# Patient Record
Sex: Female | Born: 1945 | Race: White | Hispanic: No | Marital: Married | Smoking: Never smoker
Health system: Southern US, Community
[De-identification: ages and names within clinical notes are randomized; demographics above are authoritative.]

## PROBLEM LIST (undated history)

## (undated) DIAGNOSIS — K635 Polyp of colon: Secondary | ICD-10-CM

## (undated) DIAGNOSIS — K5792 Diverticulitis of intestine, part unspecified, without perforation or abscess without bleeding: Secondary | ICD-10-CM

## (undated) DIAGNOSIS — J189 Pneumonia, unspecified organism: Secondary | ICD-10-CM

## (undated) DIAGNOSIS — E559 Vitamin D deficiency, unspecified: Secondary | ICD-10-CM

## (undated) DIAGNOSIS — F419 Anxiety disorder, unspecified: Secondary | ICD-10-CM

## (undated) DIAGNOSIS — I499 Cardiac arrhythmia, unspecified: Secondary | ICD-10-CM

## (undated) DIAGNOSIS — D649 Anemia, unspecified: Secondary | ICD-10-CM

## (undated) DIAGNOSIS — E781 Pure hyperglyceridemia: Secondary | ICD-10-CM

## (undated) DIAGNOSIS — K5790 Diverticulosis of intestine, part unspecified, without perforation or abscess without bleeding: Secondary | ICD-10-CM

## (undated) DIAGNOSIS — M1990 Unspecified osteoarthritis, unspecified site: Secondary | ICD-10-CM

## (undated) HISTORY — DX: Unspecified osteoarthritis, unspecified site: M19.90

## (undated) HISTORY — DX: Pneumonia, unspecified organism: J18.9

## (undated) HISTORY — DX: Vitamin D deficiency, unspecified: E55.9

## (undated) HISTORY — DX: Diverticulosis of intestine, part unspecified, without perforation or abscess without bleeding: K57.90

## (undated) HISTORY — DX: Cardiac arrhythmia, unspecified: I49.9

## (undated) HISTORY — PX: COLONOSCOPY: SHX174

## (undated) HISTORY — PX: TONSILLECTOMY AND ADENOIDECTOMY: SUR1326

## (undated) HISTORY — DX: Polyp of colon: K63.5

## (undated) HISTORY — DX: Anemia, unspecified: D64.9

## (undated) HISTORY — DX: Pure hyperglyceridemia: E78.1

## (undated) HISTORY — DX: Anxiety disorder, unspecified: F41.9

## (undated) HISTORY — PX: MENISCUS REPAIR: SHX5179

## (undated) HISTORY — DX: Diverticulitis of intestine, part unspecified, without perforation or abscess without bleeding: K57.92

---

## 1997-07-08 ENCOUNTER — Other Ambulatory Visit: Admission: RE | Admit: 1997-07-08 | Discharge: 1997-07-08 | Payer: Self-pay | Admitting: Obstetrics and Gynecology

## 1997-08-16 ENCOUNTER — Emergency Department (HOSPITAL_COMMUNITY): Admission: EM | Admit: 1997-08-16 | Discharge: 1997-08-17 | Payer: Self-pay | Admitting: Emergency Medicine

## 1998-09-22 ENCOUNTER — Other Ambulatory Visit: Admission: RE | Admit: 1998-09-22 | Discharge: 1998-09-22 | Payer: Self-pay | Admitting: Obstetrics and Gynecology

## 1998-09-23 ENCOUNTER — Encounter (INDEPENDENT_AMBULATORY_CARE_PROVIDER_SITE_OTHER): Payer: Self-pay | Admitting: Specialist

## 1998-09-23 ENCOUNTER — Other Ambulatory Visit: Admission: RE | Admit: 1998-09-23 | Discharge: 1998-09-23 | Payer: Self-pay | Admitting: Obstetrics and Gynecology

## 1999-12-04 ENCOUNTER — Encounter: Admission: RE | Admit: 1999-12-04 | Discharge: 1999-12-04 | Payer: Self-pay | Admitting: Obstetrics and Gynecology

## 1999-12-04 ENCOUNTER — Encounter: Payer: Self-pay | Admitting: Obstetrics and Gynecology

## 2000-08-14 ENCOUNTER — Ambulatory Visit (HOSPITAL_COMMUNITY): Admission: RE | Admit: 2000-08-14 | Discharge: 2000-08-14 | Payer: Self-pay | Admitting: Gastroenterology

## 2000-08-14 ENCOUNTER — Encounter: Payer: Self-pay | Admitting: Gastroenterology

## 2000-10-09 ENCOUNTER — Ambulatory Visit (HOSPITAL_COMMUNITY): Admission: RE | Admit: 2000-10-09 | Discharge: 2000-10-09 | Payer: Self-pay | Admitting: Gastroenterology

## 2000-10-09 ENCOUNTER — Emergency Department (HOSPITAL_COMMUNITY): Admission: EM | Admit: 2000-10-09 | Discharge: 2000-10-10 | Payer: Self-pay | Admitting: Emergency Medicine

## 2000-10-09 ENCOUNTER — Encounter: Payer: Self-pay | Admitting: *Deleted

## 2000-10-09 ENCOUNTER — Encounter (INDEPENDENT_AMBULATORY_CARE_PROVIDER_SITE_OTHER): Payer: Self-pay | Admitting: Specialist

## 2000-11-14 ENCOUNTER — Other Ambulatory Visit: Admission: RE | Admit: 2000-11-14 | Discharge: 2000-11-14 | Payer: Self-pay | Admitting: Obstetrics and Gynecology

## 2000-12-05 ENCOUNTER — Encounter: Admission: RE | Admit: 2000-12-05 | Discharge: 2000-12-05 | Payer: Self-pay | Admitting: Obstetrics and Gynecology

## 2000-12-05 ENCOUNTER — Encounter: Payer: Self-pay | Admitting: Obstetrics and Gynecology

## 2001-12-16 ENCOUNTER — Encounter: Payer: Self-pay | Admitting: Obstetrics and Gynecology

## 2001-12-16 ENCOUNTER — Encounter: Admission: RE | Admit: 2001-12-16 | Discharge: 2001-12-16 | Payer: Self-pay | Admitting: Obstetrics and Gynecology

## 2002-07-06 ENCOUNTER — Ambulatory Visit (HOSPITAL_COMMUNITY): Admission: RE | Admit: 2002-07-06 | Discharge: 2002-07-06 | Payer: Self-pay | Admitting: Emergency Medicine

## 2002-07-07 ENCOUNTER — Encounter: Payer: Self-pay | Admitting: Emergency Medicine

## 2002-07-07 ENCOUNTER — Ambulatory Visit (HOSPITAL_COMMUNITY): Admission: RE | Admit: 2002-07-07 | Discharge: 2002-07-07 | Payer: Self-pay | Admitting: Emergency Medicine

## 2003-01-14 ENCOUNTER — Ambulatory Visit (HOSPITAL_COMMUNITY): Admission: RE | Admit: 2003-01-14 | Discharge: 2003-01-14 | Payer: Self-pay | Admitting: Obstetrics and Gynecology

## 2003-03-01 ENCOUNTER — Other Ambulatory Visit: Admission: RE | Admit: 2003-03-01 | Discharge: 2003-03-01 | Payer: Self-pay | Admitting: Obstetrics and Gynecology

## 2003-12-17 ENCOUNTER — Ambulatory Visit: Payer: Self-pay | Admitting: Internal Medicine

## 2004-01-25 ENCOUNTER — Encounter: Admission: RE | Admit: 2004-01-25 | Discharge: 2004-01-25 | Payer: Self-pay | Admitting: Obstetrics and Gynecology

## 2004-03-27 ENCOUNTER — Ambulatory Visit (HOSPITAL_COMMUNITY): Admission: RE | Admit: 2004-03-27 | Discharge: 2004-03-27 | Payer: Self-pay | Admitting: Gastroenterology

## 2004-12-14 ENCOUNTER — Ambulatory Visit: Payer: Self-pay | Admitting: Internal Medicine

## 2005-02-12 ENCOUNTER — Encounter: Admission: RE | Admit: 2005-02-12 | Discharge: 2005-02-12 | Payer: Self-pay | Admitting: Obstetrics and Gynecology

## 2005-12-19 ENCOUNTER — Ambulatory Visit: Payer: Self-pay | Admitting: Internal Medicine

## 2006-02-05 HISTORY — PX: COLECTOMY: SHX59

## 2006-03-25 ENCOUNTER — Encounter: Admission: RE | Admit: 2006-03-25 | Discharge: 2006-03-25 | Payer: Self-pay | Admitting: Obstetrics and Gynecology

## 2006-05-14 ENCOUNTER — Encounter: Admission: RE | Admit: 2006-05-14 | Discharge: 2006-08-12 | Payer: Self-pay | Admitting: Family Medicine

## 2006-07-16 ENCOUNTER — Encounter: Payer: Self-pay | Admitting: Internal Medicine

## 2006-07-20 ENCOUNTER — Encounter: Admission: RE | Admit: 2006-07-20 | Discharge: 2006-07-20 | Payer: Self-pay | Admitting: Gastroenterology

## 2006-08-27 ENCOUNTER — Encounter: Payer: Self-pay | Admitting: Internal Medicine

## 2006-09-30 ENCOUNTER — Encounter: Payer: Self-pay | Admitting: Internal Medicine

## 2006-12-10 ENCOUNTER — Inpatient Hospital Stay (HOSPITAL_COMMUNITY): Admission: RE | Admit: 2006-12-10 | Discharge: 2006-12-14 | Payer: Self-pay | Admitting: General Surgery

## 2006-12-10 ENCOUNTER — Encounter (INDEPENDENT_AMBULATORY_CARE_PROVIDER_SITE_OTHER): Payer: Self-pay | Admitting: General Surgery

## 2007-01-07 ENCOUNTER — Encounter: Payer: Self-pay | Admitting: Internal Medicine

## 2007-01-22 ENCOUNTER — Encounter: Payer: Self-pay | Admitting: Internal Medicine

## 2007-08-21 ENCOUNTER — Encounter: Admission: RE | Admit: 2007-08-21 | Discharge: 2007-08-21 | Payer: Self-pay | Admitting: Obstetrics and Gynecology

## 2007-08-25 ENCOUNTER — Encounter: Admission: RE | Admit: 2007-08-25 | Discharge: 2007-08-25 | Payer: Self-pay | Admitting: General Surgery

## 2008-08-23 ENCOUNTER — Encounter: Admission: RE | Admit: 2008-08-23 | Discharge: 2008-08-23 | Payer: Self-pay | Admitting: Obstetrics and Gynecology

## 2008-11-01 ENCOUNTER — Encounter: Admission: RE | Admit: 2008-11-01 | Discharge: 2008-11-25 | Payer: Self-pay | Admitting: Family Medicine

## 2009-03-24 ENCOUNTER — Emergency Department (HOSPITAL_BASED_OUTPATIENT_CLINIC_OR_DEPARTMENT_OTHER): Admission: EM | Admit: 2009-03-24 | Discharge: 2009-03-24 | Payer: Self-pay | Admitting: Emergency Medicine

## 2009-08-24 ENCOUNTER — Encounter: Admission: RE | Admit: 2009-08-24 | Discharge: 2009-08-24 | Payer: Self-pay | Admitting: Obstetrics and Gynecology

## 2009-09-03 ENCOUNTER — Encounter: Admission: RE | Admit: 2009-09-03 | Discharge: 2009-09-03 | Payer: Self-pay | Admitting: Orthopaedic Surgery

## 2009-09-05 ENCOUNTER — Ambulatory Visit (HOSPITAL_BASED_OUTPATIENT_CLINIC_OR_DEPARTMENT_OTHER): Admission: RE | Admit: 2009-09-05 | Discharge: 2009-09-05 | Payer: Self-pay | Admitting: Orthopedic Surgery

## 2010-01-16 ENCOUNTER — Encounter: Payer: Self-pay | Admitting: Internal Medicine

## 2010-02-03 ENCOUNTER — Ambulatory Visit: Payer: Self-pay | Admitting: Internal Medicine

## 2010-02-03 ENCOUNTER — Encounter: Payer: Self-pay | Admitting: Internal Medicine

## 2010-02-03 DIAGNOSIS — R002 Palpitations: Secondary | ICD-10-CM | POA: Insufficient documentation

## 2010-02-09 ENCOUNTER — Ambulatory Visit: Admit: 2010-02-09 | Payer: Self-pay

## 2010-02-25 ENCOUNTER — Encounter: Payer: Self-pay | Admitting: General Surgery

## 2010-03-09 NOTE — Letter (Signed)
Summary: Dawn Payne - Office Visit  [facility] - Office Visit   Imported By: Dawn Payne 02/02/2010 15:12:50  _____________________________________________________________________  External Attachment:    Type:   Image     Comment:   External Document

## 2010-03-09 NOTE — Assessment & Plan Note (Addendum)
Summary: np6/ progressive palps. pt has medicare,bcbs.gd   Visit Type:  Initial Consult Primary [place]:  Ross,Allan  CC:  palpitations.  History of Present Illness: Patient is 65 year old who was referred for palpitations. ThThe patient says she has had spells over the past several years..  Episodes occur as short bursts.  Last a few seconds.  Go away on own.  Episodes not associated with SOB or dizziness.  No syncope the patient is very active  Dances competively  No change in her ability to do this.  Current Medications (verified): 1)  Advil 200 Mg Tabs (Ibuprofen) .Marland Kitchen.. 1 Tab Prn 2)  Ambien 10 Mg Tabs (Zolpidem Tartrate) .Marland Kitchen.. 1 Tab At Bedtime 2-3 Weekly Prn 3)  B Complex  Tabs (B Complex Vitamins) .... Take 1 Tablet By Mouth Once A Day 4)  Vitamin D 1000 Unit Tabs (Cholecalciferol) .... Take 1 Tablet By Mouth Once A Day 5)  Ester-C  Tabs (Bioflavonoid Products) .... Take 1 Tablet By Mouth Once A Day 6)  Calcium 600+d Plus Minerals 600-400 Mg-Unit Tabs (Calcium Carbonate-Vit D-Min) .... Take 1 Tablet By Mouth Two Times A Day  Allergies: 1)  ! Sulfa  Past History:  Past Medical History: history of diverticulitis of colon  September 2008 Chronic insomnia Plantar fascitis  Past Surgical History: Colon resection Thumb nail removed.  Family History:  Notable for congestive heart failure and renal failure   in mother and prostate cancer in family.  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 Marital Status- Married No children Drnks several cups of 1/2 caff coffee per day.  Review of Systems       All systems reviewed.  Neg to the above problem except as noted above.  Vital Signs:  Patient profile:   65 year old female Height:      63 inches Weight:      140 pounds BMI:      24.89 Pulse rate:   65 / minute Pulse rhythm:   regular Resp:     18 per minute BP sitting:   140 / 70  (left arm) Cuff size:   large  Vitals Entered By: Vikki Ports (February 03, 2010 12:02 PM)  Physical Exam  Additional Exam:  Patient is in NAD HEENT:  Normocephalic, atraumatic. EOMI, PERRLA.  Neck: JVP is normal. No thyromegaly. No bruits.  Lungs: clear to auscultation. No rales no wheezes.  Heart: Regular rate and rhythm. Normal S1, S2. No S3.   No significant murmurs. PMI not displaced.  Abdomen:  Supple, nontender. Normal bowel sounds. No masses. No hepatomegaly.  Extremities:   Good distal pulses throughout. No lower extremity edema.  Musculoskeletal :moving all extremities.  Neuro:   alert and oriented x3.    EKG  Procedure date:  02/03/2010  Findings:      NSR.  65 bpm  Impression & Recommendations:  Problem # 1:  PALPITATIONS (ICD-785.1) Patients spells are short lived.  Do not sound hemodyn significant.    Otherwise is very active. I would recomm an echo. She needs to cut out caffeine.    If they persist despite cutting caffeine out could consider an event monitor.  Would not sched now. F/U as needed.  Patient will call with response  Other Orders: Echocardiogram (Echo)  Patient Instructions: 1)  Your physician recommends that you schedule a follow-up appointment in: we will call you 2)  Your physician has requested that you have an echocardiogram.  Echocardiography is a painless test that uses sound waves to create images of your heart. It provides your doctor with information about the size and shape of your heart and how well your heart's chambers and valves are working.  This procedure takes approximately one hour. There are no restrictions for this procedure.  Appended Document: np6/ progressive palps. pt has medicare,bcbs.gd Patient cancelled echo that was scheduled for 02/09/2010.

## 2010-04-21 LAB — ANAEROBIC CULTURE: Gram Stain: NONE SEEN

## 2010-04-21 LAB — POCT HEMOGLOBIN-HEMACUE: Hemoglobin: 14 g/dL (ref 12.0–15.0)

## 2010-04-21 LAB — WOUND CULTURE: Gram Stain: NONE SEEN

## 2010-04-27 LAB — BASIC METABOLIC PANEL
BUN: 10 mg/dL (ref 6–23)
CO2: 32 mEq/L (ref 19–32)
Calcium: 8 mg/dL — ABNORMAL LOW (ref 8.4–10.5)
Chloride: 107 mEq/L (ref 96–112)
Creatinine, Ser: 0.8 mg/dL (ref 0.4–1.2)
GFR calc Af Amer: >60 mL/min (ref >60)
GFR calc non Af Amer: >60 mL/min (ref >60)
Glucose, Bld: 95 mg/dL (ref 70–99)
Potassium: 3.7 mEq/L (ref 3.5–5.1)
Sodium: 144 mEq/L (ref 135–145)

## 2010-05-03 ENCOUNTER — Other Ambulatory Visit: Payer: Self-pay | Admitting: Family Medicine

## 2010-05-03 DIAGNOSIS — M5481 Occipital neuralgia: Secondary | ICD-10-CM

## 2010-05-07 ENCOUNTER — Ambulatory Visit
Admission: RE | Admit: 2010-05-07 | Discharge: 2010-05-07 | Disposition: A | Discharge status: Home / Self Care | Payer: Medicare Other | Source: Ambulatory Visit | Attending: Family Medicine | Admitting: Family Medicine

## 2010-05-07 ENCOUNTER — Other Ambulatory Visit: Payer: Self-pay

## 2010-05-07 DIAGNOSIS — M5481 Occipital neuralgia: Secondary | ICD-10-CM

## 2010-05-15 ENCOUNTER — Other Ambulatory Visit: Payer: Self-pay | Admitting: Family Medicine

## 2010-05-15 DIAGNOSIS — M5020 Other cervical disc displacement, unspecified cervical region: Secondary | ICD-10-CM

## 2010-05-15 DIAGNOSIS — R51 Headache: Secondary | ICD-10-CM

## 2010-05-16 ENCOUNTER — Ambulatory Visit
Admission: RE | Admit: 2010-05-16 | Discharge: 2010-05-16 | Disposition: A | Discharge status: Home / Self Care | Payer: Medicare Other | Source: Ambulatory Visit | Attending: Family Medicine | Admitting: Family Medicine

## 2010-05-16 DIAGNOSIS — M5020 Other cervical disc displacement, unspecified cervical region: Secondary | ICD-10-CM

## 2010-06-20 NOTE — Op Note (Signed)
NAME:  COREE, RIESTER                 ACCOUNT NO.:  [id number]   MEDICAL RECORD NO.:  [id number]          PATIENT TYPE:  INP   LOCATION:  NA                           FACILITY:  [facility]   PHYSICIAN:  Adolph Pollack, M.D.DATE OF BIRTH:  1945/05/27   DATE OF PROCEDURE:  12/10/2006  DATE OF DISCHARGE:                               OPERATIVE REPORT   PREOPERATIVE DIAGNOSIS:  Recurrent sigmoid diverticulitis.   POSTOPERATIVE DIAGNOSIS:  Recurrent sigmoid diverticulitis.   PROCEDURE:  Laparoscopic-assisted sigmoid colectomy with mobilization of  the splenic flexure.   SURGEON:  Adolph Pollack, M.D.   ASSISTANT:  Ollen Gross. Vernell Morgans, M.D.   ANESTHESIA:  General.   INDICATIONS:  This is a 65 year old female with recurrent sigmoid  diverticulitis.  She has been debating an operation for a number of  years now, and now presents for the procedure.   TECHNIQUE:  She is brought to the operating room and placed supine on  the operating room table.  General anesthetic was administered.  She was  placed in the lithotomy position.  Foley catheter was inserted.  The  abdomen and perineum were sterilely prepped and draped.  A  supraumbilical small incision was made through the skin, subcutaneous  tissue, fascia and peritoneum.  A pursestring suture of 0 Vicryl was  placed around the fascial edges.  A Hassan trocar was introduced into  the peritoneal cavity and pneumoperitoneum was created by insufflation  of CO2 gas.   Following this, the angled laparoscope was introduced.  A 5 mm trocar  was placed in the right lower quadrant, the suprapubic area, and the  left lower quadrant.  I examined the area and noted some sigmoid colon  that appeared to be chronically inflamed and adherent to the bilateral  pelvic sidewall.  I mobilized this using sharp dissection.  I divided  the lateral attachments from the sigmoid and descending colon up to the  splenic flexure.  I then performed the Omega maneuver  and mobilized the  splenic flexure using electrocautery and sharp and blunt dissection,  allowing the splenic flexure to drop distally.   Following this, I further medialized the descending sigmoid colon and  identified the left ureter; I kept my plane of dissection anterior and  medial to this.  I then mobilized the rectosigmoid junction by dividing  the lateral attachments, and also incising the lateral peritoneum off  the mid descending colon.  This allowed for good mobilization of the  rectosigmoid junction, as well as being in the mid descending colon down  to the pelvic region.   Of note, after this I went ahead and removed the 5 mm suprapubic trocar.  I made an extraction site incision by making a limited lower midline  incision in the vertical direction through skin, subcutaneous tissue,  fascia and peritoneum.  On dividing some of the peritoneum, there was a  small superficial  burn to the area of the small bowel; which I  imbricated this area with 3-0 silk Lembert type sutures.   I then used a wound protector and brought up  the mid descending to  rectosigmoid colon up into the wound.  I picked a point at the rectum  just distal to the rectosigmoid junction.  I made an incision in the  mesentery close to the colon.  I then used a LigaSure, staying close to  colon to divide the mesentery up to the point of the mid descending  colon; where I saw the last diverticulum.  Following this, I was able to  place the descending colon and the rectum side-to-side.  I performed a  side-to-side stapled anastomosis with the Endo-GIA stapler and  reinforced the crotch with a 3-0 silk suture.  I then completed the  anastomosis with a linear non-cutting stapler by closing the common  defect; and thus completed the resection as well.  I then handed the  specimen off the field.   The anastomosis was patent; it was viable and was under no tension.  There was some bleeding from the common defect  closure, which I  controlled with 3-0 Vicryl and 3-0 silk sutures.   At this point, gloves were changed.  The anastomosis was reexamined and  was intact with good lumen.  No bleeding was noted.  Irrigation was  performed of the abdominal cavity with saline solution.  No bleeding was  noted.  A small area in the small bowel area that was reinforced after  the superficial thermal burn looked good and viable.   I then requested a sponge and instrument count; it was reported to be  correct.  I evacuated the irrigation fluid.  The fascia of the lower  midline extraction site was closed with a running #1 PDS suture.  The  subcutaneous tissue was irrigated.  I reinsufflated the abdomen and  inserted the laparoscope.  The fascial closure was solid.  No bleeding  was noted.  I then released the pneumoperitoneum and removed the  trocars.   All skin incisions were closed with staples followed by sterile  dressings.   She tolerated the procedure without apparent complications, and was  taken to recovery room in satisfactory condition.      Adolph Pollack, M.D.  Electronically Signed     TJR/MEDQ  D:  12/10/2006  T:  12/10/2006  Job:  [id number]   cc:   Fayrene Fearing L. Malon Kindle., M.D.  Fax: [phone]   Titus Dubin. Alwyn Ren, MD,FACP,FCCP  [phone] [place]  [place] [address]

## 2010-06-20 NOTE — H&P (Signed)
NAME:  Dawn Payne, Dawn Payne                 ACCOUNT NO.:  [id number]   MEDICAL RECORD NO.:  [id number]          PATIENT TYPE:  INP   LOCATION:  NA                           FACILITY:  [facility]   PHYSICIAN:  Adolph Pollack, M.D.DATE OF BIRTH:  1945-08-10   DATE OF ADMISSION:  12/10/2006  DATE OF DISCHARGE:                              HISTORY & PHYSICAL   REASON FOR ADMISSION:  Elective sigmoid colectomy.   HISTORY OF PRESENT ILLNESS:  Dawn Payne is a 65 year old female who has  had recurrent bouts of sigmoid diverticulitis.  I had seen her  approximately 3 years ago, and we discussed the surgery, but she had did  not want that at this time.  However, she continued to have problems and  now is admitted for the partial colectomy.   PAST MEDICAL HISTORY:  1. Recurrent sigmoid diverticulitis.  2. Urinary tract infection.  3. Lumbar spine disease.   PREVIOUS OPERATIONS:  D&C.   ALLERGIES:  SULFA.   MEDICATION:  She takes multiple herbals and vitamins.   SOCIAL HISTORY:  No tobacco use, occasional alcohol use.   FAMILY HISTORY:  Notable for congestive heart failure and renal failure  in mother and prostate cancer in family.   REVIEW OF SYSTEMS:  She had difficulty with the bowel prep last night  and ended up having some nausea and vomiting specifically after the  antibiotics.  For that reason, she did not get her Entereg.   PHYSICAL EXAMINATION:  GENERAL:  A well-developed, well-nourished female  who appears slightly dehydrated.  She is pleasant and cooperative.  HEENT:  EYES:  Extraocular motions intact.  No icterus.  NECK:  Supple without any masses.  VITAL SIGNS:  Temperature is 97 degrees, pulse 60, blood pressure  142/72.  RESPIRATORY:  Breath sounds equal and clear. Respirations not  labored.  CARDIOVASCULAR:  Regular rate, regular rhythm.  No murmur.  ABDOMEN:  Soft, nontender, nondistended.  No masses.  MUSCULOSKELETAL:  SCDs on.   IMPRESSION:  Recurrent sigmoid  diverticulitis.   PLAN:  Laparoscopic-assisted, possible open sigmoid colectomy.  We  discussed the procedure and risks at length preoperatively.      Adolph Pollack, M.D.  Electronically Signed     TJR/MEDQ  D:  12/10/2006  T:  12/10/2006  Job:  [id number]

## 2010-06-23 NOTE — Procedures (Signed)
[place]  Patient:    Dawn Payne, Dawn Payne Visit Number: [id number] MRN: [id number]          Service Type: END Location: ENDO Attending Physician:  Charna Elizabeth Dictated by:   Anselmo Rod, M.D. Proc. Date: 10/09/00 Admit Date:  10/09/2000 Discharge Date: 10/09/2000   CC:         Titus Dubin. Alwyn Ren, M.D. [facility]   Procedure Report  DATE OF BIRTH:  03/14/1945  REFERRING PHYSICIAN:  Titus Dubin. Alwyn Ren, M.D. [facility]  PROCEDURE PERFORMED:  Colonoscopy with snare polypectomy x 1.  ENDOSCOPIST:  Anselmo Rod, M.D.  INSTRUMENT USED:  Olympus video pediatric colonoscope.  INDICATIONS FOR PROCEDURE:  Blood in stool and history of diverticulitis in a 65 year old white female rule out colonic polyps, masses, hemorrhoids, etc.  PREPROCEDURE PREPARATION:  Informed consent was procured from the patient. The patient was fasted for eight hours prior to the procedure and prepped with a bottle of magnesium citrate and a gallon of NuLytely the night prior to the procedure.  PREPROCEDURE PHYSICAL:  The patient had stable vital signs.  Neck supple. Chest clear to auscultation.  S1, S2 regular.  Abdomen soft with normal abdominal bowel sounds.  DESCRIPTION OF PROCEDURE:  The patient was placed in the left lateral decubitus position and sedated with 100 mg of Demerol and 10 mg of Versed intravenously.  Once the patient was adequately sedated and maintained on low-flow oxygen and continuous cardiac monitoring, the Olympus video colonoscope was advanced from the rectum to the cecum with slight difficulty secondary to some residual stool in the colon.  There was extensive left-sided diverticulosis, no masses were seen.  There was a small polyp seen at 50 cm which was sessile and nature.  It was snared by snare polypectomy forceps. Small internal hemorrhoids were appreciated on retroflexion in the rectum. The patient tolerated the procedure well without  complications.  The appendiceal orifice and ileocecal valve were clearly visualized and photographed.  IMPRESSION: 1. Left-sided diverticulosis. 2. Small sessile polyp snared at 15 cm. 3. Small internal hemorrhoids.  RECOMMENDATIONS: 1. Await pathology results. 2. Avoid nuts seeds and popcorn. 3. Outpatient follow-up in the next two weeks for further recommendations.Dictated by:   Anselmo Rod, M.D. Attending Physician:  Charna Elizabeth DD:  10/09/00 TD:  10/10/00 Job: [id number]

## 2010-06-23 NOTE — Op Note (Signed)
NAME:  Dawn Payne, Dawn Payne                 ACCOUNT NO.:  [id number]   MEDICAL RECORD NO.:  [id number]          PATIENT TYPE:  AMB   LOCATION:  ENDO                         FACILITY:  [facility]   PHYSICIAN:  James L. Malon Kindle., M.D.DATE OF BIRTH:  1945-08-12   DATE OF PROCEDURE:  03/27/2004  DATE OF DISCHARGE:                                 OPERATIVE REPORT   PROCEDURE:  Colonoscopy.   MEDICATIONS:  Fentanyl 100 mcg, Versed 10 mg IV, Phenergan 12.5 mg IV.   ENDOSCOPE:  Olympus pediatric adjustable colonoscope.   INDICATIONS:  The patient has had previous diverticulitis that has gradually  improved with antibiotics.  She is being considered for surgery.  She has  been on chronic Miralax therapy.  This is done to rule out anything else  going on prior to consideration for surgery.   DESCRIPTION OF PROCEDURE:  The procedure had been explained to the patient  and consent obtained.  In the left lateral decubitus position, the Olympus  scope was inserted.  The patient was pre-opped with Phenergan, since her  previous colonoscopy by Dr. Loreta Ave resulted in lots of distention and nausea.  The scope was inserted.  The prep was quite good.  She had fairly marked  diverticular disease of the sigmoid colon.  We were able to pass it fairly  easily and advance to the cecum.  The ileocecal valve was seen and the scope  was withdrawn, and the cecum, ascending colon, transverse colon, splenic  flexure were seen well.  The descending colon revealed minimal, if any,  diverticular disease.  The sigmoid revealed marked diverticular disease, but  there was really no stricturing.  No polyps were seen throughout.  The scope  was withdrawn in the rectum, and the rectum was free of polyps.  I then  advanced the scope up beyond the sigmoid colon into the descending colon and  tried to decompress the abdomen as much as possible and withdrew the scope.  The scope was withdrawn.  The patient tolerated the procedure  well.   ASSESSMENT:  Fairly marked diverticular disease, 562.10.   PLAN:  Will continue on Miralax and see back in the office in two to three  months.  We will discuss surgery with her further.  It may be that with  chronic Miralax therapy, she will be able to avoid surgery.      JLE/MEDQ  D:  03/27/2004  T:  03/27/2004  Job:  [id number]   cc:   Titus Dubin. Alwyn Ren, M.D. [facility]   Tracey Harries, M.D.  [address]  [place]  [place] [address]  Fax: [phone]   Adolph Pollack, M.D.  [address]  [place]  [place] [address]

## 2010-06-23 NOTE — Discharge Summary (Signed)
NAME:  Dawn Payne, Dawn Payne                 ACCOUNT NO.:  [id number]   MEDICAL RECORD NO.:  [id number]          PATIENT TYPE:  INP   LOCATION:  1321                         FACILITY:  [facility]   PHYSICIAN:  Adolph Pollack, M.D.DATE OF BIRTH:  April 12, 1945   DATE OF ADMISSION:  12/10/2006  DATE OF DISCHARGE:  12/14/2006                               DISCHARGE SUMMARY   FINAL DIAGNOSIS:  Recurrent sigmoid diverticulitis   SECONDARY DIAGNOSIS:  None.   PROCEDURE:  Laparoscopic-assisted sigmoid colectomy with mobilization of  splenic flexure December 10, 2006.   REASON FOR ADMISSION:  [name] is a 65 year old female who has had  recurrent episodes of sigmoid diverticulitis.  Because of those, she now  presents for elective sigmoid colectomy.   HOSPITAL COURSE:  She underwent the above procedure which she tolerated  quite well.  Postoperatively she was started on liquid diet which was  slowly advanced.  Her bowel function began to return.  She tolerated the  advancement of diet and was a mobile.  On December 14, 2006 she was  looking fairly good, had her staples removed.  She had some mild nausea,  but this passed and then she was discharged.   DISPOSITION:  Discharged to home on December 14, 2006.  She was given  Vicodin for pain and discharge instructions.  She will follow up with  Dr. Zachery Dakins in the office in a few days to have staples removed and  follow-up with me in two to three weeks after that.      Adolph Pollack, M.D.  Electronically Signed     TJR/MEDQ  D:  01/09/2007  T:  01/09/2007  Job:  [id number]   cc:   Fayrene Fearing L. Malon Kindle., M.D.  Fax: [phone]   Titus Dubin. Alwyn Ren, MD,FACP,FCCP  [phone] [place]  [place] [address]

## 2010-11-15 LAB — URINALYSIS, ROUTINE W REFLEX MICROSCOPIC
Bilirubin Urine: NEGATIVE
Glucose, UA: NEGATIVE
Hgb urine dipstick: NEGATIVE
Ketones, ur: NEGATIVE
Nitrite: NEGATIVE
Protein, ur: NEGATIVE
Specific Gravity, Urine: 1.024
Urobilinogen, UA: 0.2
pH: 6

## 2010-11-15 LAB — COMPREHENSIVE METABOLIC PANEL
ALT: 20
AST: 19
Albumin: 3.8
CO2: 31
Chloride: 98
GFR calc Af Amer: >60
GFR calc non Af Amer: >60
Potassium: 4.1
Sodium: 136
Total Bilirubin: 0.7

## 2010-11-15 LAB — DIFFERENTIAL
Basophils Absolute: 0
Eosinophils Absolute: 0
Eosinophils Relative: 0
Monocytes Absolute: 0.6

## 2010-11-15 LAB — PROTIME-INR: Prothrombin Time: 12.5

## 2010-11-15 LAB — CBC
HCT: 37.9
Hemoglobin: 13.1
MCHC: 34.5
MCV: 92.1
Platelets: 339
RBC: 4.12
RDW: 12.6
WBC: 7.2

## 2010-11-15 LAB — TYPE AND SCREEN: Antibody Screen: NEGATIVE

## 2011-02-16 ENCOUNTER — Other Ambulatory Visit (HOSPITAL_COMMUNITY): Payer: Self-pay | Admitting: Family Medicine

## 2011-02-20 ENCOUNTER — Other Ambulatory Visit: Payer: Self-pay | Admitting: Family Medicine

## 2011-02-20 DIAGNOSIS — N644 Mastodynia: Secondary | ICD-10-CM

## 2011-02-27 ENCOUNTER — Ambulatory Visit
Admission: RE | Admit: 2011-02-27 | Discharge: 2011-02-27 | Disposition: A | Discharge status: Home / Self Care | Payer: Medicare Other | Source: Ambulatory Visit | Attending: Family Medicine | Admitting: Family Medicine

## 2011-02-27 DIAGNOSIS — N644 Mastodynia: Secondary | ICD-10-CM

## 2011-04-18 ENCOUNTER — Other Ambulatory Visit: Payer: Self-pay | Admitting: Gastroenterology

## 2011-04-18 DIAGNOSIS — R1032 Left lower quadrant pain: Secondary | ICD-10-CM

## 2011-04-23 ENCOUNTER — Ambulatory Visit
Admission: RE | Admit: 2011-04-23 | Discharge: 2011-04-23 | Disposition: A | Discharge status: Home / Self Care | Payer: Medicare Other | Source: Ambulatory Visit | Attending: Gastroenterology | Admitting: Gastroenterology

## 2011-04-23 DIAGNOSIS — R1032 Left lower quadrant pain: Secondary | ICD-10-CM

## 2011-04-23 MED ORDER — IOHEXOL 300 MG/ML  SOLN
100.0000 mL | Freq: Once | INTRAMUSCULAR | Status: AC | PRN
  Administered 2011-04-23: 100 mL via INTRAVENOUS

## 2011-06-02 ENCOUNTER — Telehealth: Payer: Self-pay | Admitting: Family Medicine

## 2011-06-02 ENCOUNTER — Ambulatory Visit (INDEPENDENT_AMBULATORY_CARE_PROVIDER_SITE_OTHER): Payer: Medicare Other | Admitting: Family Medicine

## 2011-06-02 ENCOUNTER — Ambulatory Visit: Payer: Medicare Other

## 2011-06-02 VITALS — BP 152/78 | HR 69 | Temp 100.4°F | Resp 18 | Ht 64.0 in | Wt 145.4 lb

## 2011-06-02 DIAGNOSIS — R509 Fever, unspecified: Secondary | ICD-10-CM

## 2011-06-02 DIAGNOSIS — R059 Cough, unspecified: Secondary | ICD-10-CM

## 2011-06-02 DIAGNOSIS — R110 Nausea: Secondary | ICD-10-CM

## 2011-06-02 DIAGNOSIS — R05 Cough: Secondary | ICD-10-CM

## 2011-06-02 DIAGNOSIS — J189 Pneumonia, unspecified organism: Secondary | ICD-10-CM

## 2011-06-02 MED ORDER — BENZONATATE 100 MG PO CAPS: 200.0000 mg | ORAL_CAPSULE | Freq: Two times a day (BID) | ORAL | Status: AC | PRN

## 2011-06-02 MED ORDER — LEVOFLOXACIN 500 MG PO TABS: 500.0000 mg | ORAL_TABLET | Freq: Every day | ORAL | Status: AC

## 2011-06-02 MED ORDER — FLUCONAZOLE 150 MG PO TABS: 150.0000 mg | ORAL_TABLET | Freq: Once | ORAL | Status: AC

## 2011-06-02 MED ORDER — PROMETHAZINE HCL 12.5 MG PO TABS: 12.5000 mg | ORAL_TABLET | Freq: Three times a day (TID) | ORAL | Status: DC | PRN

## 2011-06-02 NOTE — Telephone Encounter (Signed)
Lm regarding official xray reading and reminded her to come back in 4 weeks for repeat xray with PCP or [facility].

## 2011-06-02 NOTE — Progress Notes (Signed)
[facility]:  Office Visit  Chief Complaint:  Chief Complaint  Patient presents with  . Cough    chest congestion - yellowish  . Fever    chills;achy x 1 wk  . Nasal Congestion    x 1 wk  . N/D    x last night after taking ABx    HPI: Dawn Payne is a 66 y.o. female who complains of  5 day h/o worsening chest congestion, fevers, and cough. On z-pack but not improved. Taking Advil but fevers still spiking.   Past Medical History  Diagnosis Date  . Anemia    Past Surgical History  Procedure Date  . Tonsillectomy and adenoidectomy   . Gi resection     due to kink from popcorn kernal   History   Social History  . Marital Status: Married    Spouse Name: N/A    Number of Children: N/A  . Years of Education: N/A   Social History Main Topics  . Smoking status: Never Smoker   . Smokeless tobacco: None  . Alcohol Use: Yes     only on social ocassion  . Drug Use: No  . Sexually Active: Yes   Other Topics Concern  . None   Social History Narrative  . None   Family History  Problem Relation Age of Onset  . Hypertension Mother   . Kidney disease Mother   . Cancer Father   . Arthritis Sister    Allergies  Allergen Reactions  . Sulfonamide Derivatives Other (See Comments)    Pt states she turns bright red   Prior to Admission medications   Medication Sig Start Date End Date Taking? Authorizing Provider  azithromycin (ZITHROMAX) 250 MG tablet Take 250 mg by mouth daily.   Yes Historical Provider, MD  zolpidem (AMBIEN) 5 MG tablet Take 5 mg by mouth at bedtime as needed.   Yes Historical Provider, MD     ROS: The patient denies  night sweats, unintentional weight loss, chest pain, palpitations, wheezing, dyspnea on exertion, nausea, vomiting, abdominal pain, dysuria, hematuria, melena, numbness, weakness, or tingling. +fevers, chills,, sinus congestion, cough  All other systems have been reviewed and were otherwise negative with the  exception of those mentioned in the HPI and as above.    PHYSICAL EXAM: Filed Vitals:   06/02/11 1222  BP: 152/78  Pulse: 69  Temp: 100.4 F (38 C)  Resp: 18   Filed Vitals:   06/02/11 1222  Height: 5\' 4"  (1.626 m)  Weight: 145 lb 6.4 oz (65.953 kg)   Body mass index is 24.96 kg/(m^2).  General: Alert, no acute distress HEENT:  Normocephalic, atraumatic, oropharynx patent. TM nl. + red throat. + sinus pressure and tenderness.  Cardiovascular:  Regular rate and rhythm, no rubs murmurs or gallops.  No Carotid bruits, radial pulse intact. No pedal edema.  Respiratory: Clear to auscultation bilaterally.  No wheezes, rales, or rhonchi.  No cyanosis, no use of accessory musculature GI: No organomegaly, abdomen is soft and non-tender, positive bowel sounds.  No masses. Skin: No rashes. Neurologic: Facial musculature symmetric. Psychiatric: Patient is appropriate throughout our interaction. Lymphatic: No cervical lymphadenopathy Musculoskeletal: Gait intact.   LABS:    EKG/XRAY:   Primary read interpreted by Dr. Conley Rolls at [facility]. Right Lower lobe infiltrate? No pneumo. ? Slight haziness on left costophrenic angle.   ASSESSMENT/PLAN: Encounter Diagnoses  Name Primary?  . Cough Yes  . Fever   . PNA (pneumonia)   .  Nausea     PNA-RLL, changed abx from Z-pack to Levaquin 500 mg x 10 days; gave a diflucan rx for yeast infection from abx use Cough-rx meds, otc meds Nausea-promethazine F/u in 4 weeks for repeat Xray   Marcia Lepera PHUONG, DO 06/02/2011 2:05 PM

## 2012-05-19 IMAGING — CR DG CHEST 2V
2 series · 2 of 2 positions shown · non-contrast
Comparison: None.

CLINICAL DATA: Cough

CHEST - 2 VIEW

[PA]
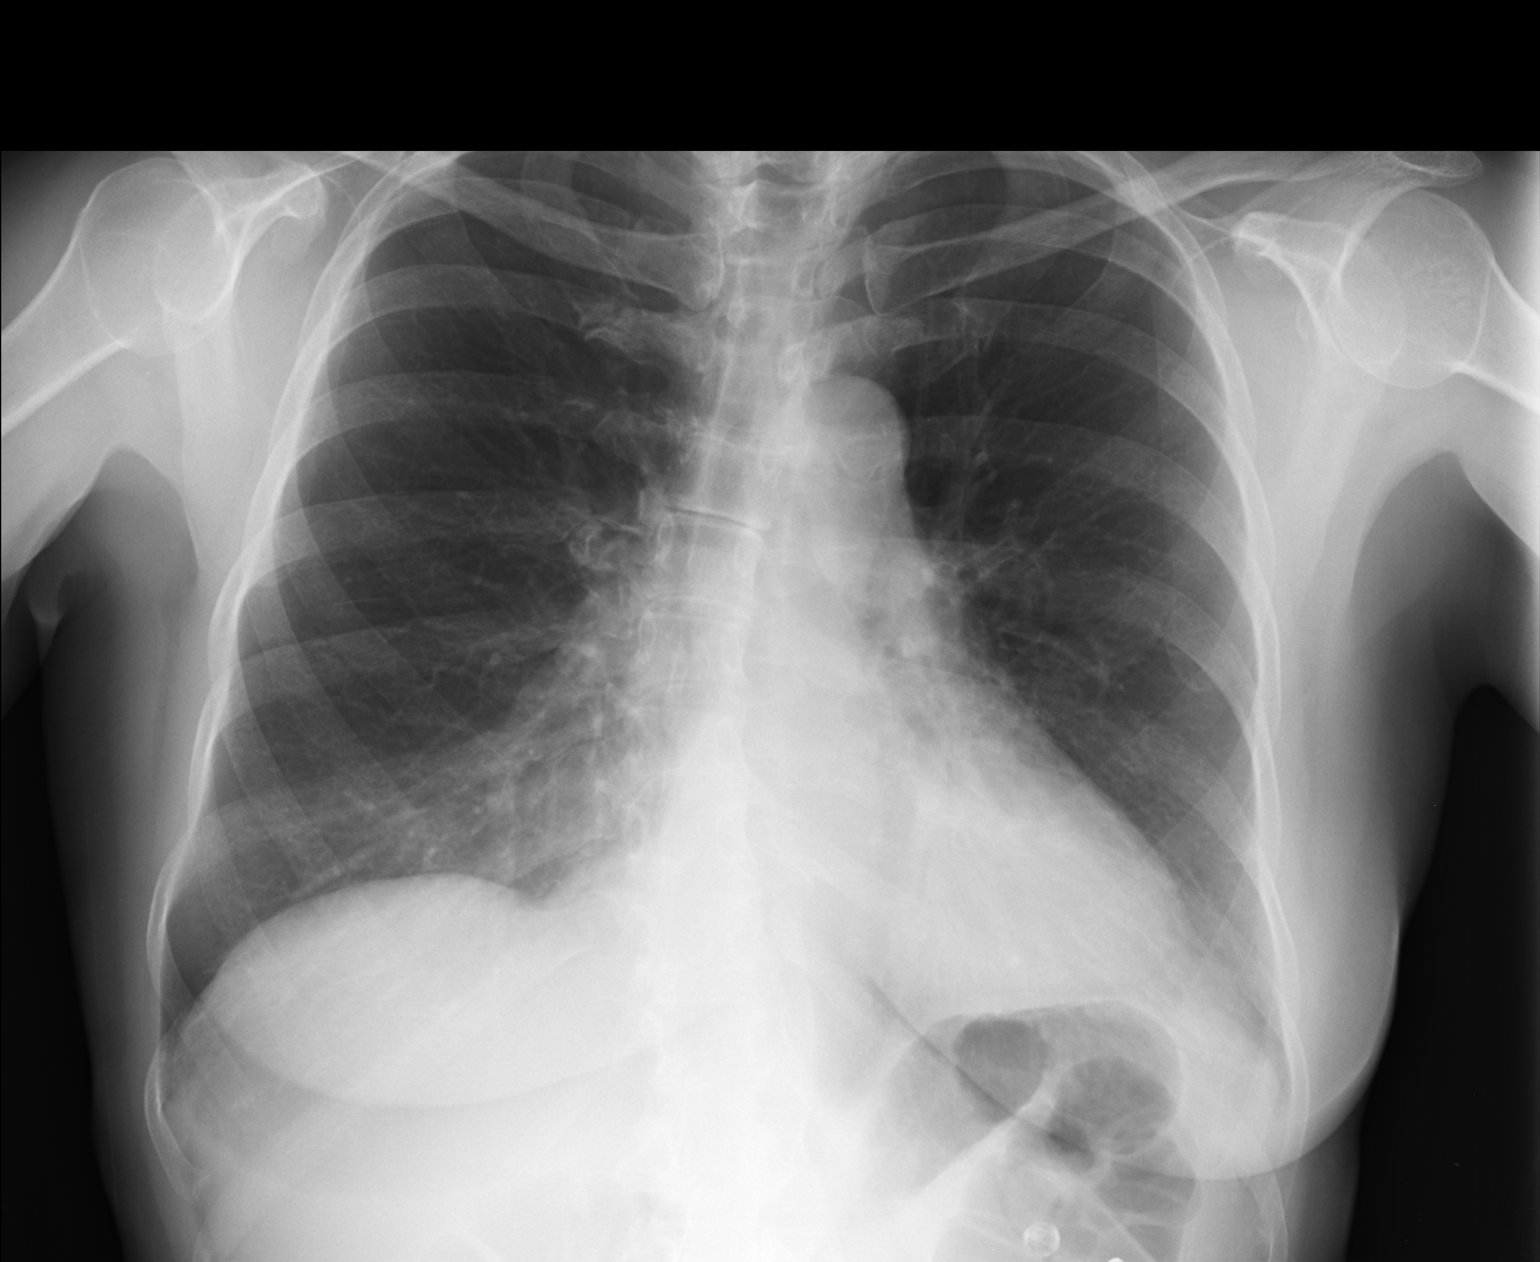

[lateral]
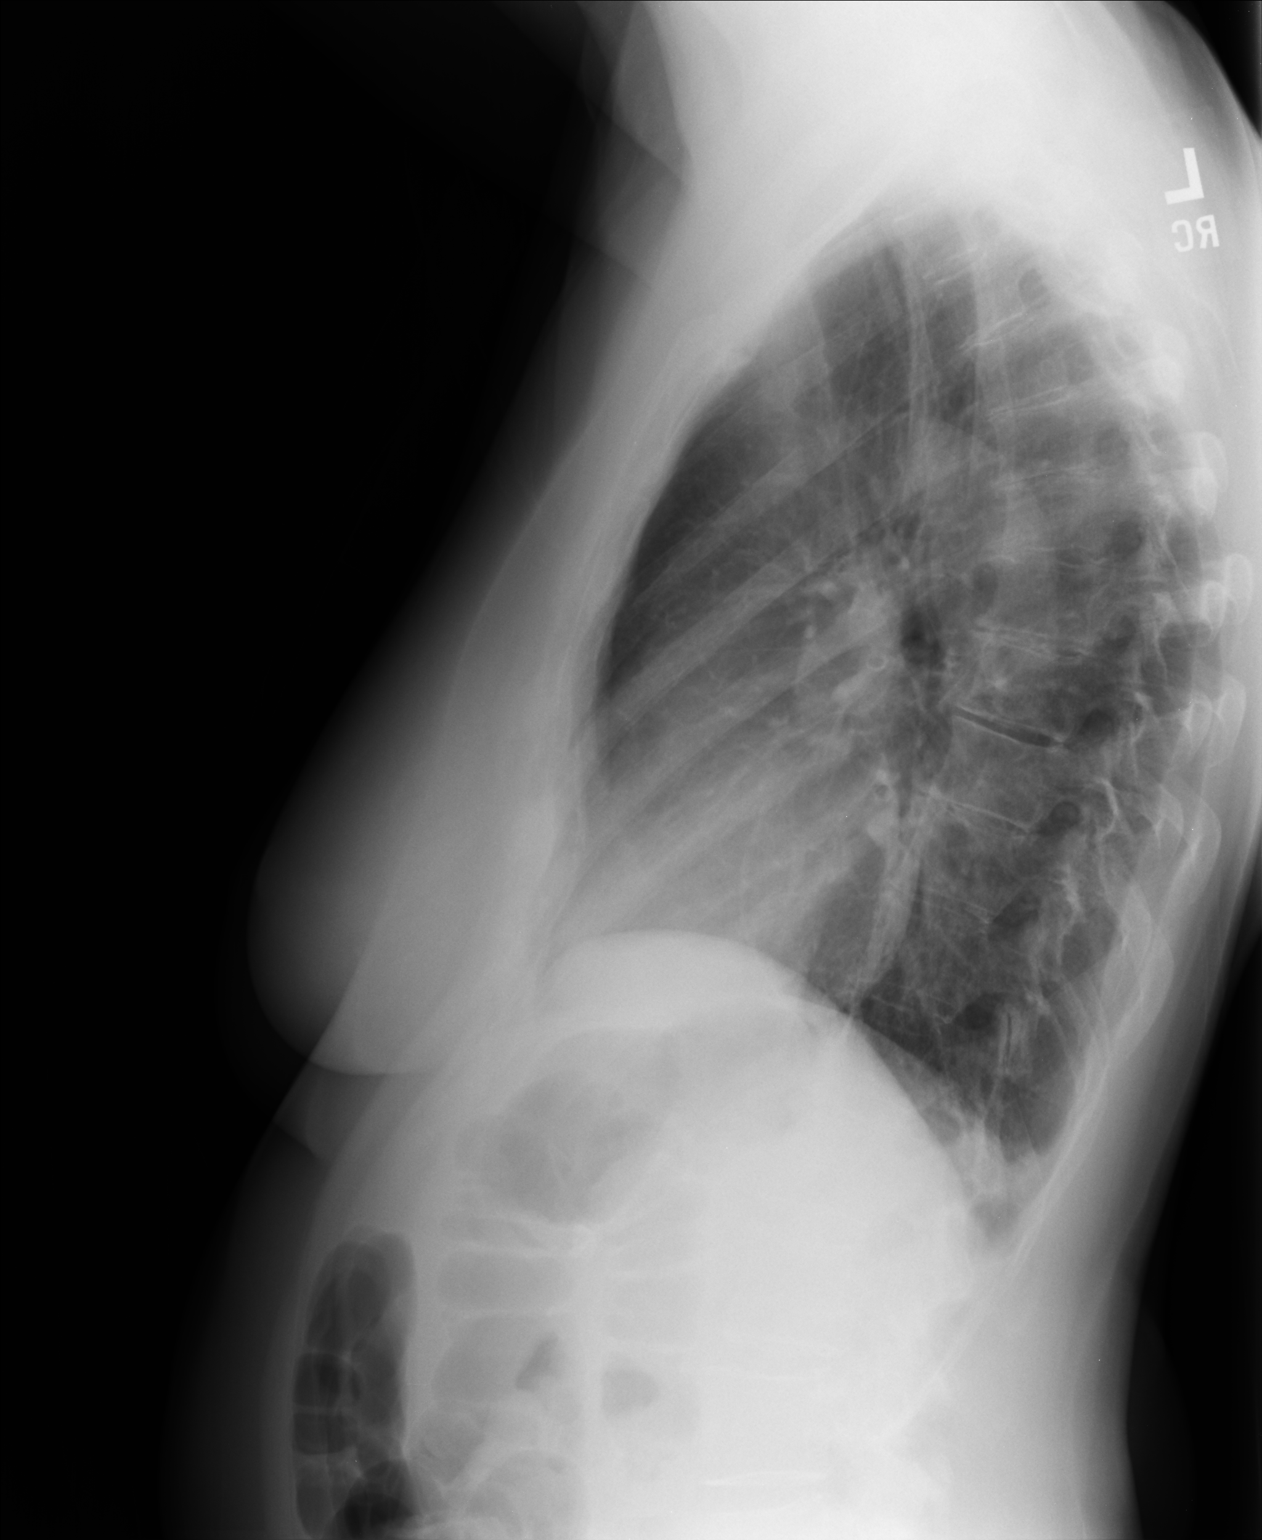

[2 of 2 positions shown; findings below may reference images not displayed]

FINDINGS: Heart size is normal.  Mediastinal shadows are normal.  I
think there are patchy infiltrates at both lung bases consistent
with basilar pneumonia.  The upper lungs are clear.  No effusions.
No bony abnormalities.
IMPRESSION: Patchy bibasilar pneumonia.

Clinically significant discrepancy from primary report, if
provided: None

## 2012-06-02 ENCOUNTER — Other Ambulatory Visit: Payer: Self-pay

## 2012-06-02 DIAGNOSIS — Z1231 Encounter for screening mammogram for malignant neoplasm of breast: Secondary | ICD-10-CM

## 2012-06-04 ENCOUNTER — Ambulatory Visit: Payer: Medicare Other

## 2012-06-06 ENCOUNTER — Other Ambulatory Visit: Payer: Self-pay | Admitting: Family Medicine

## 2012-06-06 ENCOUNTER — Ambulatory Visit
Admission: RE | Admit: 2012-06-06 | Discharge: 2012-06-06 | Disposition: A | Discharge status: Home / Self Care | Payer: Medicare Other | Source: Ambulatory Visit | Attending: Family Medicine | Admitting: Family Medicine

## 2012-06-06 DIAGNOSIS — R1032 Left lower quadrant pain: Secondary | ICD-10-CM

## 2012-06-06 MED ORDER — IOHEXOL 300 MG/ML  SOLN
100.0000 mL | Freq: Once | INTRAMUSCULAR | Status: AC | PRN
  Administered 2012-06-06: 100 mL via INTRAVENOUS

## 2012-07-02 ENCOUNTER — Ambulatory Visit
Admission: RE | Admit: 2012-07-02 | Discharge: 2012-07-02 | Disposition: A | Discharge status: Home / Self Care | Payer: Medicare Other | Source: Ambulatory Visit

## 2012-07-02 DIAGNOSIS — Z1231 Encounter for screening mammogram for malignant neoplasm of breast: Secondary | ICD-10-CM

## 2012-09-04 ENCOUNTER — Telehealth: Payer: Self-pay | Admitting: Oncology

## 2012-09-04 NOTE — Telephone Encounter (Signed)
CALLED PT TO SCHEDULE NP APPT PT REQUESTED DR. MAGRINAT WILL GAVE TO MD TO REVIEW.

## 2012-09-12 ENCOUNTER — Other Ambulatory Visit: Payer: Self-pay | Admitting: Oncology

## 2012-09-12 DIAGNOSIS — R700 Elevated erythrocyte sedimentation rate: Secondary | ICD-10-CM

## 2012-09-12 DIAGNOSIS — D869 Sarcoidosis, unspecified: Secondary | ICD-10-CM

## 2012-09-15 ENCOUNTER — Telehealth: Payer: Self-pay | Admitting: *Deleted

## 2012-09-15 NOTE — Telephone Encounter (Signed)
Message left by pt stating concern due to scheduled CT of chest then MD appt.  This RN noted pt has not been seen in office presently with referral from Dr Clarene Duke for increased sed rate.  This RN returned call to pt to discuss her concerns and to inform her MD is out of the office until 8/18. This RN informed pt due to MD out of office her questions may not be answered until his return- but inquired if RN could be of assitance.  Per discussion Dawn Payne states she would like to see MD first with labs to discuss needed scans- " he ordered a CT of my chest but all my problems are below the chest and I am a non smoker ".  Plan at present is this note will be given to MD for review upon his return for understanding and appropriate followup.  Return call number for pt is [phone] or [phone].

## 2012-09-16 ENCOUNTER — Telehealth: Payer: Self-pay | Admitting: Oncology

## 2012-09-16 ENCOUNTER — Other Ambulatory Visit: Payer: Self-pay | Admitting: *Deleted

## 2012-09-18 ENCOUNTER — Other Ambulatory Visit (HOSPITAL_BASED_OUTPATIENT_CLINIC_OR_DEPARTMENT_OTHER): Payer: Medicare Other | Admitting: Lab

## 2012-09-18 DIAGNOSIS — R700 Elevated erythrocyte sedimentation rate: Secondary | ICD-10-CM

## 2012-09-18 DIAGNOSIS — D869 Sarcoidosis, unspecified: Secondary | ICD-10-CM

## 2012-09-18 LAB — COMPREHENSIVE METABOLIC PANEL (CC13)
ALT: 12 U/L (ref 0–55)
AST: 16 U/L (ref 5–34)
Albumin: 3.6 g/dL (ref 3.5–5.0)
CO2: 28 mEq/L (ref 22–29)
Calcium: 9.7 mg/dL (ref 8.4–10.4)
Chloride: 101 mEq/L (ref 98–109)
Potassium: 5.3 mEq/L — ABNORMAL HIGH (ref 3.5–5.1)
Total Protein: 7.7 g/dL (ref 6.4–8.3)

## 2012-09-18 LAB — CBC WITH DIFFERENTIAL/PLATELET
BASO%: 0.2 % (ref 0.0–2.0)
Eosinophils Absolute: 0 10*3/uL (ref 0.0–0.5)
HCT: 38.4 % (ref 34.8–46.6)
LYMPH%: 26.2 % (ref 14.0–49.7)
MCHC: 32.8 g/dL (ref 31.5–36.0)
MONO#: 0.6 10*3/uL (ref 0.1–0.9)
NEUT#: 4.1 10*3/uL (ref 1.5–6.5)
Platelets: 315 10*3/uL (ref 145–400)
RBC: 4.12 10*6/uL (ref 3.70–5.45)
WBC: 6.4 10*3/uL (ref 3.9–10.3)
lymph#: 1.7 10*3/uL (ref 0.9–3.3)

## 2012-09-18 LAB — CHCC SMEAR

## 2012-09-21 ENCOUNTER — Other Ambulatory Visit: Payer: Self-pay | Admitting: Oncology

## 2012-09-21 DIAGNOSIS — R700 Elevated erythrocyte sedimentation rate: Secondary | ICD-10-CM

## 2012-09-22 ENCOUNTER — Telehealth: Payer: Self-pay | Admitting: *Deleted

## 2012-09-22 LAB — ANGIOTENSIN CONVERTING ENZYME: Angiotensin 1 CE: 31 U/L (ref 8–52)

## 2012-09-22 LAB — SEDIMENTATION RATE: Sed Rate: 40 mm/hr — ABNORMAL HIGH (ref 0–22)

## 2012-09-22 LAB — IMMUNOFIXATION ELECTROPHORESIS: Total Protein, Serum Electrophoresis: 7.2 g/dL (ref 6.0–8.3)

## 2012-09-22 NOTE — Telephone Encounter (Signed)
Pt will go have her cxr on 09/24/18 she stated to have her cxr...td

## 2012-09-23 ENCOUNTER — Other Ambulatory Visit: Payer: Self-pay | Admitting: Emergency Medicine

## 2012-09-23 ENCOUNTER — Ambulatory Visit (HOSPITAL_COMMUNITY)
Admission: RE | Admit: 2012-09-23 | Discharge: 2012-09-23 | Disposition: A | Discharge status: Home / Self Care | Payer: Medicare Other | Source: Ambulatory Visit | Attending: Oncology | Admitting: Oncology

## 2012-09-23 DIAGNOSIS — R700 Elevated erythrocyte sedimentation rate: Secondary | ICD-10-CM

## 2012-09-23 DIAGNOSIS — R002 Palpitations: Secondary | ICD-10-CM

## 2012-09-23 DIAGNOSIS — R918 Other nonspecific abnormal finding of lung field: Secondary | ICD-10-CM | POA: Insufficient documentation

## 2012-09-24 ENCOUNTER — Ambulatory Visit
Admission: RE | Admit: 2012-09-24 | Discharge: 2012-09-24 | Disposition: A | Discharge status: Home / Self Care | Payer: Medicare Other | Source: Ambulatory Visit | Attending: Oncology | Admitting: Oncology

## 2012-09-24 DIAGNOSIS — R002 Palpitations: Secondary | ICD-10-CM

## 2012-09-24 DIAGNOSIS — R918 Other nonspecific abnormal finding of lung field: Secondary | ICD-10-CM

## 2012-09-25 LAB — UIFE/LIGHT CHAINS/TP QN, 24-HR UR
Albumin, U: DETECTED
Free Kappa Lt Chains,Ur: 0.37 mg/dL (ref 0.14–2.42)
Free Kappa/Lambda Ratio: 12.33 ratio — ABNORMAL HIGH (ref 2.04–10.37)
Free Lambda Excretion/Day: 0.9 mg/d
Time: 24 hours
Total Protein, Urine-Ur/day: 27 mg/d (ref 10–140)
Total Protein, Urine: 0.9 mg/dL

## 2012-09-29 ENCOUNTER — Ambulatory Visit (HOSPITAL_BASED_OUTPATIENT_CLINIC_OR_DEPARTMENT_OTHER): Payer: Medicare Other | Admitting: Oncology

## 2012-09-29 VITALS — BP 142/85 | HR 60 | Temp 98.4°F | Resp 20 | Ht 64.0 in | Wt 147.5 lb

## 2012-09-29 DIAGNOSIS — R700 Elevated erythrocyte sedimentation rate: Secondary | ICD-10-CM

## 2012-09-29 NOTE — Progress Notes (Signed)
ID: Dawn Payne OB: [birth year]  MR#: [id number]  CSN#:[id number]  PCP: Mickie Hillier, MD GYN:  Noland Fordyce SU: Dillard Essex OTHER MD: Jeananne Rama   HISTORY OF PRESENT ILLNESS: Dawn Payne tells me she was found to have an elevated sedimentation rate in her 68s, and was evaluated for this at [facility], with no specific abnormality found. Dr. Clarene Duke obtained a sedimentation rate April of 2012, which was 65; January 2013, when it was 45; and July of 2014, when it was 22. More recently she was evaluated for this by Dr. Ernestina Penna, which found no evidence of ovarian cancer. The patient also saw Dr. Titus Dubin who did a very thorough workup for autoimmune disease, finding normal immunoglobulins, normal C3 and C4, negative ANA, no evidence of an M spike on serum protein collection of pheresis, negative cyclic citrulline peptide antibodies, normal extractable nuclear antibodies, and also no evidence of rash, fever, or other systemic symptoms. Dr. Titus Dubin suggested hematologic evaluation.  The patient's subsequent history is as detailed below.  INTERVAL HISTORY: Dawn Payne was evaluated in the hematology clinic 09/29/2012 accompanied by her husband Les  REVIEW OF SYSTEMS: She feels "terrific", denies any fever, rash, bruising or bleeding, unexplained weight loss, or unexplained fatigue. 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 She does have a history of bilateral hip discomfort and left lower quadrant abdominal pain. She is status post diverticular surgery remotely. She tells me she has a history of blood in her urine with negative culture June of this year. (Our. urinalysis showed no hemoglobin in the urine). A detailed review of systems was otherwise entirely negative.  PAST MEDICAL HISTORY: Past Medical History  Diagnosis Date  . Anemia     PAST SURGICAL HISTORY: Past Surgical History  Procedure Laterality Date  .  Tonsillectomy and adenoidectomy    . Gi resection      due to kink from popcorn kernal    FAMILY HISTORY Family History  Problem Relation Age of Onset  . Hypertension Mother   . Kidney disease Mother   . Cancer Father   . Arthritis Sister    the patient's father died from prostate cancer the age of 68. The patient's mother died from what sounds like renal artery stenosis complications at the age of 69. The patient had no brothers, one sister. There is no history of autoimmune disease or cancer in the family.  GYNECOLOGIC HISTORY:  Menarche age 67, she is GX P0. She took hormone replacement after menopause, stopping only in 2002.  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 She is a good friend of my patient Dawn Payne    ADVANCED DIRECTIVES: In place   HEALTH MAINTENANCE: History  Substance Use Topics  . Smoking status: Never Smoker   . Smokeless tobacco: Not on file  . Alcohol Use: Yes     Comment: only on social ocassion     Colonoscopy: 2007  PAP: May 2014  Bone density: "Normal"  Lipid panel:  Allergies  Allergen Reactions  . Sulfonamide Derivatives Other (See Comments)    Pt states she turns bright red    Current Outpatient Prescriptions  Medication Sig Dispense Refill  . zolpidem (AMBIEN) 5 MG tablet Take 5 mg by mouth at bedtime as needed.       No current facility-administered  medications for this visit.    OBJECTIVE: Middle-aged white woman who appears well Filed Vitals:   09/29/12 1353  BP: 142/85  Pulse: 60  Temp: 98.4 F (36.9 C)  Resp: 20     Body mass index is 25.31 kg/(m^2).    ECOG FS:0 - Asymptomatic  Sclerae unicteric, pupils equal round and reactive to light, EOMs intact Oropharynx clear No cervical or supraclavicular adenopathy; no axillary adenopathy Lungs no rales or  rhonchi Heart regular rate and rhythm, no murmur appreciated Abd soft, nontender to palpation including the left lower quadrant, no masses palpated, positive bowel sounds MSK no focal spinal tenderness, no peripheral edema Neuro: non-focal, well-oriented, appropriate affect Breasts: Deferred Skin: No rash noted   LAB RESULTS: Results for DEBBE, CRUMBLE (MRN [id number]) as of 09/29/2012 19:09  Ref. Range 09/18/2012 11:08  Sed Rate Latest Range: 0-22 mm/hr 40 (H)  Results for JAQUIA, BENEDICTO (MRN [id number]) as of 09/29/2012 19:09  Ref. Range 09/18/2012 11:08  CRP Latest Range: <0.60 mg/dL 0.5   Results for TECIA, CINNAMON (MRN [id number]) as of 09/29/2012 19:09  Ref. Range 09/18/2012 11:08  CK Total Latest Range: 7-177 U/L 56  Results for DEHLIA, KILNER (MRN [id number]) as of 09/29/2012 19:09  Ref. Range 09/18/2012 11:08  LDH Latest Range: 125-245 U/L 157   Results for HAELY, LEYLAND (MRN [id number]) as of 09/29/2012 19:09  Ref. Range 09/18/2012 11:08  Angiotensin 1 CE Latest Range: 8-52 U/L 31   CMP     Component Value Date/Time   NA 137 09/18/2012 1108   NA 144 03/24/2009 1315   K 5.3* 09/18/2012 1108   K 3.7 03/24/2009 1315   CL 107 03/24/2009 1315   CO2 28 09/18/2012 1108   CO2 32 03/24/2009 1315   GLUCOSE 83 09/18/2012 1108   GLUCOSE 95 03/24/2009 1315   BUN 13.7 09/18/2012 1108   BUN 10 03/24/2009 1315   CREATININE 0.8 09/18/2012 1108   CREATININE .8 03/24/2009 1315   CALCIUM 9.7 09/18/2012 1108   CALCIUM 8.0* 03/24/2009 1315   PROT 7.7 09/18/2012 1108   PROT 7.4 12/04/2006 1010   ALBUMIN 3.6 09/18/2012 1108   ALBUMIN 3.8 12/04/2006 1010   AST 16 09/18/2012 1108   AST 19 12/04/2006 1010   ALT 12 09/18/2012 1108   ALT 20 12/04/2006 1010   ALKPHOS 69 09/18/2012 1108   ALKPHOS 66 12/04/2006 1010   BILITOT 0.30 09/18/2012 1108   BILITOT 0.7 12/04/2006 1010   GFRNONAA >60 03/24/2009 1315   GFRAA  Value: >60        The eGFR has been calculated using the MDRD equation. This calculation has not been  validated in all clinical situations. eGFR's persistently <60 mL/min signify possible Chronic Kidney Disease. 03/24/2009 1315    I No results found for this basename: SPEP, UPEP,  kappa and lambda light chains    Lab Results  Component Value Date   WBC 6.4 09/18/2012   NEUTROABS 4.1 09/18/2012   HGB 12.6 09/18/2012   HCT 38.4 09/18/2012   MCV 93.2 09/18/2012   PLT 315 09/18/2012      Chemistry      Component Value Date/Time   NA 137 09/18/2012 1108   NA 144 03/24/2009 1315   K 5.3* 09/18/2012 1108   K 3.7 03/24/2009 1315   CL 107 03/24/2009 1315   CO2 28 09/18/2012 1108   CO2 32 03/24/2009 1315   BUN 13.7 09/18/2012 1108   BUN 10 03/24/2009 1315  CREATININE 0.8 09/18/2012 1108   CREATININE .8 03/24/2009 1315      Component Value Date/Time   CALCIUM 9.7 09/18/2012 1108   CALCIUM 8.0* 03/24/2009 1315   ALKPHOS 69 09/18/2012 1108   ALKPHOS 66 12/04/2006 1010   AST 16 09/18/2012 1108   AST 19 12/04/2006 1010   ALT 12 09/18/2012 1108   ALT 20 12/04/2006 1010   BILITOT 0.30 09/18/2012 1108   BILITOT 0.7 12/04/2006 1010       No results found for this basename: LABCA2    No components found with this basename: LABCA125    No results found for this basename: INR,  in the last 168 hours  Urinalysis    Component Value Date/Time   COLORURINE YELLOW 12/04/2006 1000   APPEARANCEUR CLEAR 12/04/2006 1000   LABSPEC 1.024 12/04/2006 1000   PHURINE 6.0 12/04/2006 1000   GLUCOSEU NEGATIVE 12/04/2006 1000   HGBUR NEGATIVE 12/04/2006 1000   BILIRUBINUR NEGATIVE 12/04/2006 1000   KETONESUR NEGATIVE 12/04/2006 1000   PROTEINUR NEGATIVE 12/04/2006 1000   UROBILINOGEN 0.2 12/04/2006 1000   NITRITE NEGATIVE 12/04/2006 1000   LEUKOCYTESUR NEGATIVE MICROSCOPIC NOT DONE ON URINES WITH NEGATIVE PROTEIN, BLOOD, LEUKOCYTES, NITRITE, OR GLUCOSE <1000 mg/dL. 12/04/2006 1000  Results for LAPORCHIA, NAKAJIMA (MRN [id number]) as of 09/29/2012 19:09  Ref. Range 09/23/2012 08:28  Time-UPE24 No range found 24   Volume, Urine-UPE24 No range found 3000  Total Protein, Urine-UPE24 No range found 0.9  Total Protein, Urine-Ur/day Latest Range: 10-140 mg/day 27  ALBUMIN, U Latest Range: DETECTED  DETECTED  Alpha 1, Urine Latest Range: NONE DET  NONE DET  Alpha 2, Urine Latest Range: NONE DET  NONE DET  Beta, Urine Latest Range: NONE DET  NONE DET  Gamma Globulin, Urine Latest Range: NONE DET  NONE DET  Free Kappa Lt Chains,Ur Latest Range: 0.14-2.42 mg/dL 4.09  Free Lt Chn Excr Rate No range found 11.10  Free Lambda Lt Chains,Ur Latest Range: 0.02-0.67 mg/dL <8.11  Free Lambda Excretion/Day No range found 0.90  Free Kappa/Lambda Ratio Latest Range: 2.04-10.37 ratio 12.33 (H)    STUDIES: Dg Chest 2 View  09/23/2012   *RADIOLOGY REPORT*  Clinical Data: Abnormal sedimentation rate  CHEST - 2 VIEW  Comparison:  March 28, 2011  Findings:   There is a 9 mm nodular opacity in the left lower lung zone regions seen only on the frontal view.  Elsewhere, lungs clear.  Heart is upper normal in size with normal pulmonary vascularity.  No adenopathy.  There is degenerative change in the upper thoracic spine.  There is mid thoracic dextroscoliosis.    IMPRESSION: 9 mm nodular opacity left lower lobe region.  Advise correlation with noncontrast enhanced chest CT to further evaluate. No edema or consolidation.  No demonstrable adenopathy.   Original Report Authenticated By: Bretta Bang, M.D.   Ct Chest Wo Contrast  09/24/2012   *RADIOLOGY REPORT*  Clinical Data: Left-sided lung nodule.  CT CHEST WITHOUT CONTRAST  Technique:  Multidetector CT imaging of the chest was performed following the standard protocol without IV contrast.  Comparison: Chest x-ray from 09/23/2012.  Findings:   There is no axillary lymphadenopathy.  No mediastinal or hilar lymphadenopathy.  The heart is upper normal for size.  No pericardial or pleural effusion.  Tiny calcified granuloma is identified in the anterior right upper lobe.  There is  some atelectasis or scarring in the anteromedial right middle lobe.  There is no pulmonary parenchymal nodule or mass.  Specifically, no evidence for left lung nodule as suggested on the recent chest x-ray.  There is some compressive atelectasis in the left dependent lung base.  Bone windows reveal no worrisome lytic or sclerotic osseous lesions.  IMPRESSION: No acute findings in the chest.  Specifically, no evidence for left lung nodule.  The appearance on the recent chest x-ray probably represents a confluence of shadows.   Original Report Authenticated By: Kennith Center, M.D.    ASSESSMENT: 67 y.o. [place] woman with a history of a persistently elevated sedimentation rate (in the 40-60 range) dating back to her 6s; with negative and extensive workup  PLAN: I reviewed all her lab work here as well as some of her past lab work. This specific reason for this consult is to evaluate for possible oncologic cause of her elevated sedimentation rate. She understands that cancer can fall into 3 categories, 2 of which, the carcinoma since her comments, manifest themselves as tumor masses. She had abdominal and pelvic CT scans in May of this year and a chest CT more recently, showing no evidence of abnormal mass. Accordingly I feel confident she does not have overt carcinoma or sarcoma at this point.  We then talked about the third type of cancer, namely the "white cell cancers". I have reviewed her blood film, which showed no immaturity. Her complete blood count and differential are normal. Her LDH is normal, indicating no  increased cell turnover overall there is no evidence of leukemia. Physical exam and CT scan showed no adenopathy, so lymphoma is unlikely. Finally the patient had a negative serum protein electrophoresis, has normal levels of immunoglobulins, and no significant light chains in her urine. Accordingly I do not have evidence of a primary gammopathy.  A CK is normal, making myositis unlikely.   The beta-2 microglobulin is minimally elevated. This is nonspecific and can be seen with minor stimulation of the immune system for any cause. There is also no evidence of sarcoid.  In short, despite extensive workup we have found no etiology for Lada's elevated sedimentation rate. It is reassuring that her C-reactive protein is normal. We discussed other sedimentation rate is measured, and it may be that she has a genetic change in her red cells are in her immunoglobulins that causes the cells to settle more quickly than normal. The fact that this has been going on for a good 40 years without any apparent ill effects tells me that further evaluation is unnecessary.  I will be glad to see Damian again in the future if the need arises, but at this point and no further appointments have been made for her here.  Lowella Dell, MD   09/29/2012 6:52 PM

## 2012-10-24 ENCOUNTER — Encounter: Payer: Self-pay | Admitting: *Deleted

## 2012-10-30 ENCOUNTER — Ambulatory Visit (INDEPENDENT_AMBULATORY_CARE_PROVIDER_SITE_OTHER): Payer: Medicare Other | Admitting: Internal Medicine

## 2012-10-30 VITALS — BP 130/80 | HR 64 | Wt 147.0 lb

## 2012-10-30 DIAGNOSIS — Z87898 Personal history of other specified conditions: Secondary | ICD-10-CM

## 2012-10-30 DIAGNOSIS — Z8679 Personal history of other diseases of the circulatory system: Secondary | ICD-10-CM

## 2012-10-30 DIAGNOSIS — E875 Hyperkalemia: Secondary | ICD-10-CM

## 2012-10-30 DIAGNOSIS — R002 Palpitations: Secondary | ICD-10-CM

## 2012-10-30 LAB — BASIC METABOLIC PANEL
BUN: 13 mg/dL (ref 6–23)
Calcium: 9.6 mg/dL (ref 8.4–10.5)
GFR: 73.68 mL/min (ref >60.00)
Glucose, Bld: 87 mg/dL (ref 70–99)
Potassium: 4.1 mEq/L (ref 3.5–5.1)
Sodium: 138 mEq/L (ref 135–145)

## 2012-10-30 NOTE — Progress Notes (Signed)
HPI Patient is a 67 yo who is referred for evlauation of palpitations  I saw her in 2011  palpitatiosn at that time were short lived  Echo was set up  She did not have done She was seen in Heme clinic recently  Pulse was noted to be irregular  She notes occasional skipds  Not prolonged  No dizziness  No SOB  No CP She is very active  Does competitive shag dancing  Going to national compitition Allergies  Allergen Reactions  . Sulfonamide Derivatives Other (See Comments)    Pt states she turns bright red    Current Outpatient Prescriptions  Medication Sig Dispense Refill  . ALPRAZolam (XANAX) 0.5 MG tablet Take 0.5 mg by mouth at bedtime as needed for sleep.      . B Complex Vitamins (VITAMIN-B COMPLEX PO) Take 1 tablet by mouth daily.      Marland Kitchen Bioflavonoid Products (ESTER C PO) Take 1 tablet by mouth daily.      . ergocalciferol (VITAMIN D2) 50000 UNITS capsule Take 50,000 Units by mouth once a week.      . fish oil-omega-3 fatty acids 1000 MG capsule Take 2 g by mouth daily.      Marland Kitchen MAGNESIUM PO Take 1 tablet by mouth daily.      . Probiotic Product (PROBIOTIC DAILY PO) Take 2 tablets by mouth daily.      Marland Kitchen zolpidem (AMBIEN) 5 MG tablet Take 5 mg by mouth at bedtime as needed.       No current facility-administered medications for this visit.    Past Medical History  Diagnosis Date  . Anemia     Past Surgical History  Procedure Laterality Date  . Tonsillectomy and adenoidectomy    . Gi resection      due to kink from popcorn kernal    Family History  Problem Relation Age of Onset  . Hypertension Mother   . Kidney disease Mother   . Cancer Father   . Arthritis Sister     History   Social History  . Marital Status: Married    Spouse Name: N/A    Number of Children: N/A  . Years of Education: N/A   Occupational History  . Not on file.   Social History Main Topics  . Smoking status: Never Smoker   . Smokeless tobacco: Not on file  . Alcohol Use: Yes     Comment:  only on social ocassion  . Drug Use: No  . Sexual Activity: Yes   Other Topics Concern  . Not on file   Social History Narrative  . No narrative on file    Review of Systems:  All systems reviewed.  They are negative to the above problem except as previously stated.  Vital Signs: BP 130/80  Pulse 64  Wt 147 lb (66.679 kg)  BMI 25.22 kg/m2  Physical Exam Patient is in NAD HEENT:  Normocephalic, atraumatic. EOMI, PERRLA.  Neck: JVP is normal.  No bruits.  Lungs: clear to auscultation. No rales no wheezes.  Heart: Regular rate and rhythm. Normal S1, S2. No S3.   No significant murmurs. PMI not displaced.  Abdomen:  Supple, nontender. Normal bowel sounds. No masses. No hepatomegaly.  Extremities:   Good distal pulses throughout. No lower extremity edema.  Musculoskeletal :moving all extremities.  Neuro:   alert and oriented x3.  CN II-XII grossly intact.  EKG  SR 64  Occasional PCA  Assessment and Plan: 1.  Palpitations  Sound like isolated skips  Overall she is doing very well.  I am not too concerned  Very active Stay active  Watch excess stimulant  Stay hydrated.  2.  HCM  Will check on lipids  3.  Hyperkalemia  Patient's last labs K was 5.3  She has been drinking coconut juice  Will repeat.  F/u prn.

## 2012-10-30 NOTE — Patient Instructions (Signed)
Will obtain labs today and call you with the results (bmet)  Your physician recommends that you continue on your current medications as directed. Please refer to the Current Medication list given to you today.  Follow up as needed

## 2012-12-11 ENCOUNTER — Other Ambulatory Visit: Payer: Self-pay

## 2013-03-13 DIAGNOSIS — H01009 Unspecified blepharitis unspecified eye, unspecified eyelid: Secondary | ICD-10-CM | POA: Diagnosis not present

## 2013-03-13 DIAGNOSIS — H10409 Unspecified chronic conjunctivitis, unspecified eye: Secondary | ICD-10-CM | POA: Diagnosis not present

## 2013-04-30 DIAGNOSIS — F411 Generalized anxiety disorder: Secondary | ICD-10-CM | POA: Diagnosis not present

## 2013-07-15 DIAGNOSIS — M19079 Primary osteoarthritis, unspecified ankle and foot: Secondary | ICD-10-CM | POA: Diagnosis not present

## 2013-07-15 DIAGNOSIS — M7750 Other enthesopathy of unspecified foot: Secondary | ICD-10-CM | POA: Diagnosis not present

## 2013-07-30 DIAGNOSIS — H0019 Chalazion unspecified eye, unspecified eyelid: Secondary | ICD-10-CM | POA: Diagnosis not present

## 2013-09-01 DIAGNOSIS — F411 Generalized anxiety disorder: Secondary | ICD-10-CM | POA: Diagnosis not present

## 2013-09-02 DIAGNOSIS — E781 Pure hyperglyceridemia: Secondary | ICD-10-CM | POA: Diagnosis not present

## 2013-09-02 DIAGNOSIS — D649 Anemia, unspecified: Secondary | ICD-10-CM | POA: Diagnosis not present

## 2013-09-02 DIAGNOSIS — E559 Vitamin D deficiency, unspecified: Secondary | ICD-10-CM | POA: Diagnosis not present

## 2013-09-02 DIAGNOSIS — Z79899 Other long term (current) drug therapy: Secondary | ICD-10-CM | POA: Diagnosis not present

## 2013-09-02 DIAGNOSIS — Z23 Encounter for immunization: Secondary | ICD-10-CM | POA: Diagnosis not present

## 2013-11-25 DIAGNOSIS — Z23 Encounter for immunization: Secondary | ICD-10-CM | POA: Diagnosis not present

## 2013-12-10 DIAGNOSIS — N390 Urinary tract infection, site not specified: Secondary | ICD-10-CM | POA: Diagnosis not present

## 2013-12-10 DIAGNOSIS — R300 Dysuria: Secondary | ICD-10-CM | POA: Diagnosis not present

## 2013-12-23 ENCOUNTER — Other Ambulatory Visit: Payer: Self-pay

## 2013-12-23 DIAGNOSIS — Z1231 Encounter for screening mammogram for malignant neoplasm of breast: Secondary | ICD-10-CM

## 2014-01-18 ENCOUNTER — Ambulatory Visit
Admission: RE | Admit: 2014-01-18 | Discharge: 2014-01-18 | Disposition: A | Discharge status: Home / Self Care | Payer: Medicare Other | Source: Ambulatory Visit

## 2014-01-18 DIAGNOSIS — Z1231 Encounter for screening mammogram for malignant neoplasm of breast: Secondary | ICD-10-CM | POA: Diagnosis not present

## 2014-02-10 DIAGNOSIS — G4700 Insomnia, unspecified: Secondary | ICD-10-CM | POA: Diagnosis not present

## 2014-02-10 DIAGNOSIS — F411 Generalized anxiety disorder: Secondary | ICD-10-CM | POA: Diagnosis not present

## 2014-03-13 ENCOUNTER — Emergency Department (HOSPITAL_BASED_OUTPATIENT_CLINIC_OR_DEPARTMENT_OTHER)
Admission: EM | Admit: 2014-03-13 | Discharge: 2014-03-13 | Disposition: A | Discharge status: Home / Self Care | Payer: Medicare Other | Attending: Emergency Medicine | Admitting: Emergency Medicine

## 2014-03-13 ENCOUNTER — Emergency Department (HOSPITAL_BASED_OUTPATIENT_CLINIC_OR_DEPARTMENT_OTHER): Payer: Medicare Other

## 2014-03-13 ENCOUNTER — Encounter (HOSPITAL_BASED_OUTPATIENT_CLINIC_OR_DEPARTMENT_OTHER): Payer: Self-pay

## 2014-03-13 DIAGNOSIS — J029 Acute pharyngitis, unspecified: Secondary | ICD-10-CM

## 2014-03-13 DIAGNOSIS — Z862 Personal history of diseases of the blood and blood-forming organs and certain disorders involving the immune mechanism: Secondary | ICD-10-CM | POA: Diagnosis not present

## 2014-03-13 DIAGNOSIS — J069 Acute upper respiratory infection, unspecified: Secondary | ICD-10-CM | POA: Diagnosis not present

## 2014-03-13 DIAGNOSIS — R0689 Other abnormalities of breathing: Secondary | ICD-10-CM | POA: Diagnosis not present

## 2014-03-13 DIAGNOSIS — Z79899 Other long term (current) drug therapy: Secondary | ICD-10-CM | POA: Insufficient documentation

## 2014-03-13 DIAGNOSIS — R05 Cough: Secondary | ICD-10-CM | POA: Diagnosis not present

## 2014-03-13 MED ORDER — DEXAMETHASONE SODIUM PHOSPHATE 10 MG/ML IJ SOLN
8.0000 mg | Freq: Once | INTRAMUSCULAR | Status: AC
  Administered 2014-03-13: 8 mg via INTRAMUSCULAR
  Filled 2014-03-13: qty 1

## 2014-03-13 NOTE — ED Provider Notes (Signed)
CSN: [id number]     Arrival date & time 03/13/14  1957 History  This chart was scribed for Dawn Clonts, MD by Martinique Peace, ED Scribe. The patient was seen in [facility]04/[facility]. The patient's care was started at 9:45 PM.    Chief Complaint  Patient presents with  . Cough      Patient is a 69 y.o. female presenting with cough. The history is provided by the patient. No language interpreter was used.  Cough Cough characteristics:  Productive Sputum characteristics:  Yellow Duration:  3 days Associated symptoms: chills and sore throat   Associated symptoms: no fever     HPI Comments: Dawn Payne is a 69 y.o. female who presents to the Emergency Department complaining of productive cough onset 3 days ago with congestion and trouble swallowing. Pt states that she feels like the area around her throat is swollen. 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 She denies fever. Pt has tried drinking tea and honey along with Nyquil. Allergies to Sulfonamide derivatives.    Past Medical History  Diagnosis Date  . Anemia    Past Surgical History  Procedure Laterality Date  . Tonsillectomy and adenoidectomy    . Gi resection      due to kink from popcorn kernal  . Knee arthroscopy     Family History  Problem Relation Age of Onset  . Hypertension Mother   . Kidney disease Mother   . Cancer Father   . Arthritis Sister    History  Substance Use Topics  . Smoking status: Never Smoker   . Smokeless tobacco: Not on file  . Alcohol Use: No     Comment: only on social ocassion   OB History    No data available     Review of Systems  Constitutional: Positive for chills. Negative for fever.  HENT: Positive for congestion, sore throat and trouble swallowing.   Respiratory: Positive for cough.       Allergies  Sulfonamide derivatives  Home Medications   Prior to Admission medications   Medication Sig Start Date End Date Taking? Authorizing Provider  ALPRAZolam Duanne Moron)  0.5 MG tablet Take 0.5 mg by mouth at bedtime as needed for sleep.    Historical Provider, MD  B Complex Vitamins (VITAMIN-B COMPLEX PO) Take 1 tablet by mouth daily.    Historical Provider, MD  Bioflavonoid Products (ESTER C PO) Take 1 tablet by mouth daily.    Historical Provider, MD  ergocalciferol (VITAMIN D2) 50000 UNITS capsule Take 50,000 Units by mouth once a week.    Historical Provider, MD  fish oil-omega-3 fatty acids 1000 MG capsule Take 2 g by mouth daily.    Historical Provider, MD  MAGNESIUM PO Take 1 tablet by mouth daily.    Historical Provider, MD  Probiotic Product (PROBIOTIC DAILY PO) Take 2 tablets by mouth daily.    Historical Provider, MD  zolpidem (AMBIEN) 5 MG tablet Take 5 mg by mouth at bedtime as needed.    Historical Provider, MD   BP 168/82 mmHg  Pulse 70  Temp(Src) 98.1 F (36.7 C) (Oral)  Resp 18  Ht 5\' 4"  (1.626 m)  Wt 146 lb (66.225 kg)  BMI 25.05 kg/m2  SpO2 100% Physical Exam  Constitutional: She is oriented to person, place, and time. She appears well-developed and well-nourished. No distress.  HENT:  Head: Normocephalic and atraumatic.  Mouth/Throat: Posterior oropharyngeal erythema present.  Eyes: Conjunctivae and EOM are normal.  Neck: Neck supple. No tracheal deviation present.  Cardiovascular: Normal rate and regular rhythm.   Pulmonary/Chest: Effort normal and breath sounds normal. No respiratory distress.  Musculoskeletal: Normal range of motion.  Lymphadenopathy:    She has cervical adenopathy.  Neurological: She is alert and oriented to person, place, and time.  Skin: Skin is warm and dry.  Psychiatric: She has a normal mood and affect. Her behavior is normal.  Nursing note and vitals reviewed.   ED Course  Procedures (including critical care time) Labs Review Labs Reviewed - No data to display  Imaging Review Dg Chest 2 View  03/13/2014   CLINICAL DATA:  Three-day history of cough and congestion.  EXAM: CHEST  2 VIEW   COMPARISON:  09/23/2012  FINDINGS: The cardiac silhouette, mediastinal and hilar contours are within normal limits and stable. There is mild tortuosity and calcification of the thoracic aorta. The lungs are clear. No pleural effusion. The bony thorax is intact.  IMPRESSION: No acute cardiopulmonary findings.   Electronically Signed   By: Kalman Jewels M.D.   On: 03/13/2014 20:47     EKG Interpretation None     Medications  dexamethasone (DECADRON) injection 8 mg (8 mg Intramuscular Given 03/13/14 2207)    9:51 PM- Treatment plan was discussed with patient who verbalizes understanding and agrees.   MDM   Final diagnoses:  Pharyngitis  URI (upper respiratory infection)   Pt with multiple symptoms likely viral in origin similar to students. CXR reviewed no acute infiltrate.  Discussed supportive care and reasons to return, discussed lateral neck xray however pt wishes to hold as agrees likely viral/ very low suspicion for epiglottitis or other significant infection.  Results and differential diagnosis were discussed with the patient/parent/guardian. Close follow up outpatient was discussed, comfortable with the plan.   Medications  dexamethasone (DECADRON) injection 8 mg (8 mg Intramuscular Given 03/13/14 2207)    Filed Vitals:   03/13/14 2010 03/13/14 2020 03/13/14 2211  BP:  168/82 154/94  Pulse: 70  54  Temp: 98.1 F (36.7 C)  98.2 F (36.8 C)  TempSrc: Oral  Oral  Resp: 18  16  Height: 5\' 4"  (1.626 m)    Weight: 146 lb (66.225 kg)    SpO2: 100%  99%    Final diagnoses:  Pharyngitis  URI (upper respiratory infection)       Dawn Clonts, MD 03/14/14 1020

## 2014-03-13 NOTE — ED Notes (Addendum)
Pt reports 3 days of cough and congestion and feels throat is closing up d/t cough.  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  Reports that previously she had symptoms of same and had pneumonia.  Denies fever.  Reports when she feels this "its like an elephant is sitting on my chest."  Airway currently patent, tonsils and back of throat slightly red, no difficulty talking or handling secretions.

## 2014-03-13 NOTE — ED Notes (Signed)
Pt states she cannot wait any longer for her discharge papers, states "I am leaving now."

## 2014-03-13 NOTE — Discharge Instructions (Signed)
If you were given medicines take as directed.  If you are on coumadin or contraceptives realize their levels and effectiveness is altered by many different medicines.  If you have any reaction (rash, tongues swelling, other) to the medicines stop taking and see a physician.   Please follow up as directed and return to the ER or see a physician for new or worsening symptoms.  Thank you. Filed Vitals:   03/13/14 2010 03/13/14 2020  BP:  168/82  Pulse: 70   Temp: 98.1 F (36.7 C)   TempSrc: Oral   Resp: 18   Height: 5\' 4"  (1.626 m)   Weight: 146 lb (66.225 kg)   SpO2: 100%

## 2014-03-13 NOTE — ED Notes (Signed)
Pt discharged by another nurse departed w/o papers

## 2014-05-08 ENCOUNTER — Encounter (HOSPITAL_BASED_OUTPATIENT_CLINIC_OR_DEPARTMENT_OTHER): Payer: Self-pay | Admitting: Emergency Medicine

## 2014-05-08 ENCOUNTER — Emergency Department (HOSPITAL_BASED_OUTPATIENT_CLINIC_OR_DEPARTMENT_OTHER)
Admission: EM | Admit: 2014-05-08 | Discharge: 2014-05-09 | Disposition: A | Discharge status: Home / Self Care | Payer: Medicare Other | Attending: Emergency Medicine | Admitting: Emergency Medicine

## 2014-05-08 DIAGNOSIS — Z79899 Other long term (current) drug therapy: Secondary | ICD-10-CM | POA: Diagnosis not present

## 2014-05-08 DIAGNOSIS — R112 Nausea with vomiting, unspecified: Secondary | ICD-10-CM | POA: Diagnosis present

## 2014-05-08 DIAGNOSIS — Z862 Personal history of diseases of the blood and blood-forming organs and certain disorders involving the immune mechanism: Secondary | ICD-10-CM | POA: Diagnosis not present

## 2014-05-08 DIAGNOSIS — K529 Noninfective gastroenteritis and colitis, unspecified: Secondary | ICD-10-CM | POA: Diagnosis not present

## 2014-05-08 LAB — COMPREHENSIVE METABOLIC PANEL
ALBUMIN: 4 g/dL (ref 3.5–5.2)
ALT: 15 U/L (ref 0–35)
AST: 22 U/L (ref 0–37)
Alkaline Phosphatase: 65 U/L (ref 39–117)
Anion gap: 9 (ref 5–15)
BUN: 16 mg/dL (ref 6–23)
CO2: 26 mmol/L (ref 19–32)
Calcium: 9.2 mg/dL (ref 8.4–10.5)
Chloride: 99 mmol/L (ref 96–112)
Creatinine, Ser: 0.9 mg/dL (ref 0.50–1.10)
GFR calc Af Amer: 74 mL/min — ABNORMAL LOW (ref >90)
GFR, EST NON AFRICAN AMERICAN: 64 mL/min — AB (ref >90)
GLUCOSE: 145 mg/dL — AB (ref 70–99)
Potassium: 3.9 mmol/L (ref 3.5–5.1)
SODIUM: 134 mmol/L — AB (ref 135–145)
Total Bilirubin: 0.2 mg/dL — ABNORMAL LOW (ref 0.3–1.2)
Total Protein: 8.1 g/dL (ref 6.0–8.3)

## 2014-05-08 LAB — CBC WITH DIFFERENTIAL/PLATELET
Basophils Absolute: 0 10*3/uL (ref 0.0–0.1)
Basophils Relative: 0 % (ref 0–1)
EOS ABS: 0 10*3/uL (ref 0.0–0.7)
Eosinophils Relative: 0 % (ref 0–5)
HCT: 42.5 % (ref 36.0–46.0)
Hemoglobin: 14.1 g/dL (ref 12.0–15.0)
Lymphocytes Relative: 4 % — ABNORMAL LOW (ref 12–46)
Lymphs Abs: 0.4 10*3/uL — ABNORMAL LOW (ref 0.7–4.0)
MCH: 30.9 pg (ref 26.0–34.0)
MCHC: 33.2 g/dL (ref 30.0–36.0)
MCV: 93 fL (ref 78.0–100.0)
MONOS PCT: 4 % (ref 3–12)
Monocytes Absolute: 0.4 10*3/uL (ref 0.1–1.0)
NEUTROS ABS: 8.5 10*3/uL — AB (ref 1.7–7.7)
Neutrophils Relative %: 92 % — ABNORMAL HIGH (ref 43–77)
Platelets: 269 10*3/uL (ref 150–400)
RBC: 4.57 MIL/uL (ref 3.87–5.11)
RDW: 12.3 % (ref 11.5–15.5)
WBC: 9.3 10*3/uL (ref 4.0–10.5)

## 2014-05-08 MED ORDER — ONDANSETRON HCL 4 MG/2ML IJ SOLN
4.0000 mg | Freq: Once | INTRAMUSCULAR | Status: AC
  Administered 2014-05-08: 4 mg via INTRAVENOUS
  Filled 2014-05-08: qty 2

## 2014-05-08 MED ORDER — ONDANSETRON 8 MG PO TBDP: ORAL_TABLET | ORAL | Status: DC

## 2014-05-08 MED ORDER — KETOROLAC TROMETHAMINE 30 MG/ML IJ SOLN
30.0000 mg | Freq: Once | INTRAMUSCULAR | Status: AC
  Administered 2014-05-08: 30 mg via INTRAVENOUS
  Filled 2014-05-08: qty 1

## 2014-05-08 MED ORDER — SODIUM CHLORIDE 0.9 % IV BOLUS (SEPSIS)
1000.0000 mL | Freq: Once | INTRAVENOUS | Status: AC
  Administered 2014-05-08: 1000 mL via INTRAVENOUS

## 2014-05-08 NOTE — ED Notes (Signed)
Assumed care of patient from [place], [place]. Pt lying in bed resting quietly at this time. No complaints.

## 2014-05-08 NOTE — Discharge Instructions (Signed)
Zofran as prescribed as needed for nausea.  Clear liquid diet for the next 12 hours, then slowly advance to normal.  Return to the emergency department if you develop worsening abdominal pain, bloody stool, or other new and concerning symptoms.   Viral Gastroenteritis Viral gastroenteritis is also known as stomach flu. This condition affects the stomach and intestinal tract. It can cause sudden diarrhea and vomiting. The illness typically lasts 3 to 8 days. Most people develop an immune response that eventually gets rid of the virus. While this natural response develops, the virus can make you quite ill. CAUSES  Many different viruses can cause gastroenteritis, such as rotavirus or noroviruses. You can catch one of these viruses by consuming contaminated food or water. You may also catch a virus by sharing utensils or other personal items with an infected person or by touching a contaminated surface. SYMPTOMS  The most common symptoms are diarrhea and vomiting. These problems can cause a severe loss of body fluids (dehydration) and a body salt (electrolyte) imbalance. Other symptoms may include:  Fever.  Headache.  Fatigue.  Abdominal pain. DIAGNOSIS  Your caregiver can usually diagnose viral gastroenteritis based on your symptoms and a physical exam. A stool sample may also be taken to test for the presence of viruses or other infections. TREATMENT  This illness typically goes away on its own. Treatments are aimed at rehydration. The most serious cases of viral gastroenteritis involve vomiting so severely that you are not able to keep fluids down. In these cases, fluids must be given through an intravenous line (IV). HOME CARE INSTRUCTIONS   Drink enough fluids to keep your urine clear or pale yellow. Drink small amounts of fluids frequently and increase the amounts as tolerated.  Ask your caregiver for specific rehydration instructions.  Avoid:  Foods high in  sugar.  Alcohol.  Carbonated drinks.  Tobacco.  Juice.  Caffeine drinks.  Extremely hot or cold fluids.  Fatty, greasy foods.  Too much intake of anything at one time.  Dairy products until 24 to 48 hours after diarrhea stops.  You may consume probiotics. Probiotics are active cultures of beneficial bacteria. They may lessen the amount and number of diarrheal stools in adults. Probiotics can be found in yogurt with active cultures and in supplements.  Wash your hands well to avoid spreading the virus.  Only take over-the-counter or prescription medicines for pain, discomfort, or fever as directed by your caregiver. Do not give aspirin to children. Antidiarrheal medicines are not recommended.  Ask your caregiver if you should continue to take your regular prescribed and over-the-counter medicines.  Keep all follow-up appointments as directed by your caregiver. SEEK IMMEDIATE MEDICAL CARE IF:   You are unable to keep fluids down.  You do not urinate at least once every 6 to 8 hours.  You develop shortness of breath.  You notice blood in your stool or vomit. This may look like coffee grounds.  You have abdominal pain that increases or is concentrated in one small area (localized).  You have persistent vomiting or diarrhea.  You have a fever.  The patient is a child younger than 3 months, and he or she has a fever.  The patient is a child older than 3 months, and he or she has a fever and persistent symptoms.  The patient is a child older than 3 months, and he or she has a fever and symptoms suddenly get worse.  The patient is a baby, and he or  she has no tears when crying. MAKE SURE YOU:   Understand these instructions.  Will watch your condition.  Will get help right away if you are not doing well or get worse. Document Released: 01/22/2005 Document Revised: 04/16/2011 Document Reviewed: 11/08/2010 [facility] Patient Information 2015 [place], [place]. This  information is not intended to replace advice given to you by your health care provider. Make sure you discuss any questions you have with your health care provider.

## 2014-05-08 NOTE — ED Provider Notes (Signed)
CSN: [id number]     Arrival date & time 05/08/14  2110 History  This chart was scribed for Veryl Speak, MD by Jeanell Sparrow, ED Scribe. This patient was seen in room MH05/MH05 and the patient's care was started at 10:27 PM.   Chief Complaint  Patient presents with  . Nausea  . Emesis   Patient is a 69 y.o. female presenting with vomiting. The history is provided by the patient. No language interpreter was used.  Emesis Severity:  Moderate Duration:  2 days Timing:  Intermittent Progression:  Unchanged Chronicity:  New Context: not post-tussive and not self-induced   Relieved by:  None tried Worsened by:  Nothing tried Associated symptoms: diarrhea and fever   Risk factors: sick contacts     HPI Comments: Dawn Payne is a 69 y.o. female who presents to the Emergency Department complaining of constant moderate fever that started last night. She states that she was sick last night with a fever of 102, nausea, vomiting, and diarrhea. Pt's temperature in the ED is 100. She reports no modifying factors. She denies any blood in her emesis or stool. She reports sick contacts.   Past Medical History  Diagnosis Date  . Anemia    Past Surgical History  Procedure Laterality Date  . Tonsillectomy and adenoidectomy    . Gi resection      due to kink from popcorn kernal  . Knee arthroscopy     Family History  Problem Relation Age of Onset  . Hypertension Mother   . Kidney disease Mother   . Cancer Father   . Arthritis Sister    History  Substance Use Topics  . Smoking status: Never Smoker   . Smokeless tobacco: Never Used  . Alcohol Use: No     Comment: only on social ocassion   OB History    No data available     Review of Systems  Gastrointestinal: Positive for vomiting and diarrhea.    Allergies  Sulfonamide derivatives  Home Medications   Prior to Admission medications   Medication Sig Start Date End Date Taking? Authorizing Provider  ALPRAZolam Duanne Moron) 0.5 MG  tablet Take 0.5 mg by mouth at bedtime as needed for sleep.    Historical Provider, MD  B Complex Vitamins (VITAMIN-B COMPLEX PO) Take 1 tablet by mouth daily.    Historical Provider, MD  Bioflavonoid Products (ESTER C PO) Take 1 tablet by mouth daily.    Historical Provider, MD  ergocalciferol (VITAMIN D2) 50000 UNITS capsule Take 50,000 Units by mouth once a week.    Historical Provider, MD  fish oil-omega-3 fatty acids 1000 MG capsule Take 2 g by mouth daily.    Historical Provider, MD  MAGNESIUM PO Take 1 tablet by mouth daily.    Historical Provider, MD  Probiotic Product (PROBIOTIC DAILY PO) Take 2 tablets by mouth daily.    Historical Provider, MD  zolpidem (AMBIEN) 5 MG tablet Take 5 mg by mouth at bedtime as needed.    Historical Provider, MD   BP 153/64 mmHg  Pulse 83  Temp(Src) 100 F (37.8 C) (Oral)  Ht 5' 3.5" (1.613 m)  Wt 143 lb (64.864 kg)  BMI 24.93 kg/m2  SpO2 97% Physical Exam  Constitutional: She is oriented to person, place, and time. She appears well-developed and well-nourished. No distress.  HENT:  Head: Normocephalic and atraumatic.  Mouth/Throat: Oropharynx is clear and moist. No oropharyngeal exudate.  Eyes: Conjunctivae and EOM are normal. Pupils are equal,  round, and reactive to light.  Neck: Normal range of motion. Neck supple.  No meningismus.  Cardiovascular: Normal rate, regular rhythm, normal heart sounds and intact distal pulses.   No murmur heard. Pulmonary/Chest: Effort normal and breath sounds normal. No respiratory distress.  Abdominal: Soft. There is no tenderness. There is no rebound and no guarding.  Musculoskeletal: Normal range of motion. She exhibits no edema or tenderness.  Neurological: She is alert and oriented to person, place, and time. No cranial nerve deficit. She exhibits normal muscle tone. Coordination normal.  No ataxia on finger to nose bilaterally. No pronator drift. 5/5 strength throughout. CN 2-12 intact. Negative Romberg.  Equal grip strength. Sensation intact. Gait is normal.   Skin: Skin is warm.  Psychiatric: She has a normal mood and affect. Her behavior is normal.  Nursing note and vitals reviewed.   ED Course  Procedures (including critical care time) DIAGNOSTIC STUDIES: Oxygen Saturation is 97% on RA, normal by my interpretation.    COORDINATION OF CARE: 10:31 PM- Pt advised of plan for treatment which includes medication and labs and pt agrees.  Labs Review Labs Reviewed - No data to display  Imaging Review No results found.   EKG Interpretation None      MDM   Final diagnoses:  None    Patient's presentation, exam, and workup are consistent with a viral gastroenteritis. She is feeling better with fluids and medications in the ER. She will be discharged to home with Zofran and when necessary return.  I personally performed the services described in this documentation, which was scribed in my presence. The recorded information has been reviewed and is accurate.       Veryl Speak, MD 05/08/14 [phone]

## 2014-05-08 NOTE — ED Notes (Signed)
Pt reports N/V/D onset today unable to keep food down and has had 4 emesis and 1 diarrhea stools tonight now feeling very weak

## 2014-07-08 DIAGNOSIS — H2513 Age-related nuclear cataract, bilateral: Secondary | ICD-10-CM | POA: Diagnosis not present

## 2014-07-08 DIAGNOSIS — H5203 Hypermetropia, bilateral: Secondary | ICD-10-CM | POA: Diagnosis not present

## 2014-07-15 DIAGNOSIS — G4700 Insomnia, unspecified: Secondary | ICD-10-CM | POA: Diagnosis not present

## 2014-07-15 DIAGNOSIS — F411 Generalized anxiety disorder: Secondary | ICD-10-CM | POA: Diagnosis not present

## 2014-08-02 ENCOUNTER — Other Ambulatory Visit: Payer: Self-pay

## 2014-09-04 ENCOUNTER — Ambulatory Visit (INDEPENDENT_AMBULATORY_CARE_PROVIDER_SITE_OTHER): Payer: Medicare Other | Admitting: Internal Medicine

## 2014-09-04 VITALS — BP 128/68 | HR 73 | Temp 97.8°F | Resp 16 | Ht 63.0 in | Wt 152.0 lb

## 2014-09-04 DIAGNOSIS — R21 Rash and other nonspecific skin eruption: Secondary | ICD-10-CM | POA: Diagnosis not present

## 2014-09-04 MED ORDER — TRIAMCINOLONE ACETONIDE 0.1 % EX CREA: 1.0000 "application " | TOPICAL_CREAM | Freq: Two times a day (BID) | CUTANEOUS | Status: DC

## 2014-09-04 MED ORDER — VALACYCLOVIR HCL 1 G PO TABS: 1000.0000 mg | ORAL_TABLET | Freq: Three times a day (TID) | ORAL | Status: DC

## 2014-09-04 NOTE — Progress Notes (Addendum)
Subjective:    Patient ID: Dawn Payne, female    DOB: 06/23/1945, 69 y.o.   MRN: [id number] This chart was scribed for Dawn Lin, MD by Marti Sleigh, Medical Scribe. This patient was seen in Room 10 and the patient's care was started a 12:48 PM.  Chief Complaint  Patient presents with  . Rash on lower back    itching, x this morning    HPI HPI Comments: [name] is a 69 y.o. female who presents to [facility] complaining of a rash on her lower back for 24hrs with associated itchiness. She was outdoors yesterday. Pt denies pain associated with the rash. Pt has never had shingles in the past and has had the shingles vaccine.  Patient Active Problem List   Diagnosis Date Noted  . ESR raised 09/29/2012  . PALPITATIONS 02/03/2010    Current outpatient prescriptions:  .  ALPRAZolam (XANAX) 0.5 MG tablet, Take 0.5 mg by mouth at bedtime as needed for sleep., Disp: , Rfl:  .  B Complex Vitamins (VITAMIN-B COMPLEX PO), Take 1 tablet by mouth daily., Disp: , Rfl:  .  Bioflavonoid Products (ESTER C PO), Take 1 tablet by mouth daily., Disp: , Rfl:  .  ergocalciferol (VITAMIN D2) 50000 UNITS capsule, Take 50,000 Units by mouth once a week., Disp: , Rfl:  .  fish oil-omega-3 fatty acids 1000 MG capsule, Take 2 g by mouth daily., Disp: , Rfl:  .  MAGNESIUM PO, Take 1 tablet by mouth daily., Disp: , Rfl:  .  ondansetron (ZOFRAN ODT) 8 MG disintegrating tablet, 18m ODT q4 hours prn nausea (Patient not taking: Reported on 09/04/2014), Disp: 6 tablet, Rfl: 0 .  Probiotic Product (PROBIOTIC DAILY PO), Take 2 tablets by mouth daily., Disp: , Rfl:  .  triamcinolone cream (KENALOG) 0.1 %, Apply 1 application topically 2 (two) times daily., Disp: 30 g, Rfl: 0 . .  zolpidem (AMBIEN) 5 MG tablet, Take 5 mg by mouth at bedtime as needed., Disp: , Rfl:     Review of Systems  Constitutional: Negative for fever and chills.  Respiratory: Negative for chest tightness and shortness of breath.   Skin:  Positive for color change and rash. Negative for wound.       Objective:   Physical Exam  Constitutional: She is oriented to person, place, and time. She appears well-developed and well-nourished. No distress.  HENT:  Head: Normocephalic and atraumatic.  Eyes: Pupils are equal, round, and reactive to light.  Neck: Neck supple.  Cardiovascular: Normal rate.   Pulmonary/Chest: Effort normal. No respiratory distress.  Musculoskeletal: Normal range of motion.  Neurological: She is alert and oriented to person, place, and time. Coordination normal.  Skin: Skin is warm and dry. She is not diaphoretic.  She has 3 erythematous papules in the left L1 dermatome that are located in the axillary lines. These lesions are very slightly tender. There are no vesiculations. There is minimal hypersensitivity light touch in the other noninvolved skin. No vesciculation is seen.  Psychiatric: She has a normal mood and affect. Her behavior is normal.  Nursing note and vitals reviewed.      Assessment & Plan:  Rash and nonspecific skin eruption  she has distinct lesions in one dermatome and although these look like bug bites this could be early herpes zoster. She has an upcoming dance competition and would like to be covered just in case. Meds ordered this encounter  Medications  . valACYclovir (VALTREX) 1000 MG tablet  Sig: Take 1 tablet (1,000 mg total) by mouth 3 (three) times daily.    Dispense:  21 tablet    Refill:  0  . triamcinolone cream (KENALOG) 0.1 %    Sig: Apply 1 application topically 2 (two) times daily.    Dispense:  30 g    Refill:  0      I have completed the patient encounter in its entirety as documented by the scribe, with editing by me where necessary. Dawn Payne, M.D.

## 2014-09-24 ENCOUNTER — Encounter (HOSPITAL_COMMUNITY): Payer: Self-pay | Admitting: Emergency Medicine

## 2014-09-24 ENCOUNTER — Emergency Department (HOSPITAL_COMMUNITY)
Admission: EM | Admit: 2014-09-24 | Discharge: 2014-09-24 | Disposition: A | Discharge status: Home / Self Care | Payer: No Typology Code available for payment source | Attending: Emergency Medicine | Admitting: Emergency Medicine

## 2014-09-24 ENCOUNTER — Emergency Department (HOSPITAL_COMMUNITY): Payer: No Typology Code available for payment source

## 2014-09-24 DIAGNOSIS — S0990XA Unspecified injury of head, initial encounter: Secondary | ICD-10-CM | POA: Insufficient documentation

## 2014-09-24 DIAGNOSIS — S199XXA Unspecified injury of neck, initial encounter: Secondary | ICD-10-CM | POA: Diagnosis not present

## 2014-09-24 DIAGNOSIS — S29001A Unspecified injury of muscle and tendon of front wall of thorax, initial encounter: Secondary | ICD-10-CM | POA: Diagnosis not present

## 2014-09-24 DIAGNOSIS — Z79899 Other long term (current) drug therapy: Secondary | ICD-10-CM | POA: Diagnosis not present

## 2014-09-24 DIAGNOSIS — Z862 Personal history of diseases of the blood and blood-forming organs and certain disorders involving the immune mechanism: Secondary | ICD-10-CM | POA: Diagnosis not present

## 2014-09-24 DIAGNOSIS — Y998 Other external cause status: Secondary | ICD-10-CM | POA: Diagnosis not present

## 2014-09-24 DIAGNOSIS — Y9389 Activity, other specified: Secondary | ICD-10-CM | POA: Insufficient documentation

## 2014-09-24 DIAGNOSIS — Z8739 Personal history of other diseases of the musculoskeletal system and connective tissue: Secondary | ICD-10-CM | POA: Insufficient documentation

## 2014-09-24 DIAGNOSIS — Y92410 Unspecified street and highway as the place of occurrence of the external cause: Secondary | ICD-10-CM | POA: Diagnosis not present
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
## 2014-09-24 NOTE — Discharge Instructions (Signed)

## 2014-09-24 NOTE — ED Notes (Signed)
Pt wanted to leave, she was tired of waiting. Was willing to leave AMA. Provider made aware and said she would discharge pt.

## 2014-09-24 NOTE — ED Notes (Signed)
Pt escorted to discharge window. Pt verbalized understanding discharge instructions. In no acute distress.  

## 2014-10-01 ENCOUNTER — Ambulatory Visit (INDEPENDENT_AMBULATORY_CARE_PROVIDER_SITE_OTHER): Payer: Medicare Other | Admitting: Physician Assistant

## 2014-10-01 VITALS — BP 126/84 | HR 61 | Temp 97.9°F | Resp 16 | Ht 63.0 in

## 2014-10-01 DIAGNOSIS — M62838 Other muscle spasm: Secondary | ICD-10-CM

## 2014-10-01 DIAGNOSIS — S134XXA Sprain of ligaments of cervical spine, initial encounter: Secondary | ICD-10-CM

## 2014-10-01 MED ORDER — CYCLOBENZAPRINE HCL 5 MG PO TABS: 5.0000 mg | ORAL_TABLET | Freq: Three times a day (TID) | ORAL | Status: DC | PRN
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
## 2014-10-01 NOTE — Patient Instructions (Signed)
Continuing to cycle between NSAIDs and tylenol every 6 hours will help with the inflammation and pain. Cycling between heat prior to activity and ice afterward will help. Dr Jola Baptist on [address] is a Patent examiner who we recommend.  Taking the flexeril up to every 8 hours as needed will help to ease off the tension and spasm.  Keep in mind this can make you drowsy so use with caution if taking during the day.   Cervical Sprain A cervical sprain is an injury in the neck in which the strong, fibrous tissues (ligaments) that connect your neck bones stretch or tear. Cervical sprains can range from mild to severe. Severe cervical sprains can cause the neck vertebrae to be unstable. This can lead to damage of the spinal cord and can result in serious nervous system problems. The amount of time it takes for a cervical sprain to get better depends on the cause and extent of the injury. Most cervical sprains heal in 1 to 3 weeks. CAUSES  Severe cervical sprains may be caused by:   Contact sport injuries (such as from football, rugby, wrestling, hockey, auto racing, gymnastics, diving, martial arts, or boxing).   Motor vehicle collisions.   Whiplash injuries. This is an injury from a sudden forward and backward whipping movement of the head and neck.  Falls.  Mild cervical sprains may be caused by:   Being in an awkward position, such as while cradling a telephone between your ear and shoulder.   Sitting in a chair that does not offer proper support.   Working at a poorly Landscape architect station.   Looking up or down for long periods of time.  SYMPTOMS   Pain, soreness, stiffness, or a burning sensation in the front, back, or sides of the neck. This discomfort may develop immediately after the injury or slowly, 24 hours or more after the injury.   Pain or tenderness directly in the middle of the back of the neck.   Shoulder or upper back pain.   Limited ability to  move the neck.   Headache.   Dizziness.   Weakness, numbness, or tingling in the hands or arms.   Muscle spasms.   Difficulty swallowing or chewing.   Tenderness and swelling of the neck.  DIAGNOSIS  Most of the time your health care provider can diagnose a cervical sprain by taking your history and doing a physical exam. Your health care provider will ask about previous neck injuries and any known neck problems, such as arthritis in the neck. X-rays may be taken to find out if there are any other problems, such as with the bones of the neck. Other tests, such as a CT scan or MRI, may also be needed.  TREATMENT  Treatment depends on the severity of the cervical sprain. Mild sprains can be treated with rest, keeping the neck in place (immobilization), and pain medicines. Severe cervical sprains are immediately immobilized. Further treatment is done to help with pain, muscle spasms, and other symptoms and may include:  Medicines, such as pain relievers, numbing medicines, or muscle relaxants.   Physical therapy. This may involve stretching exercises, strengthening exercises, and posture training. Exercises and improved posture can help stabilize the neck, strengthen muscles, and help stop symptoms from returning.  HOME CARE INSTRUCTIONS   Put ice on the injured area.   Put ice in a plastic bag.   Place a towel between your skin and the bag.   Leave the ice  on for 15-20 minutes, 3-4 times a day.   If your injury was severe, you may have been given a cervical collar to wear. A cervical collar is a two-piece collar designed to keep your neck from moving while it heals.  Do not remove the collar unless instructed by your health care provider.  If you have long hair, keep it outside of the collar.  Ask your health care provider before making any adjustments to your collar. Minor adjustments may be required over time to improve comfort and reduce pressure on your chin or on  the back of your head.  Ifyou are allowed to remove the collar for cleaning or bathing, follow your health care provider's instructions on how to do so safely.  Keep your collar clean by wiping it with mild soap and water and drying it completely. If the collar you have been given includes removable pads, remove them every 1-2 days and hand wash them with soap and water. Allow them to air dry. They should be completely dry before you wear them in the collar.  If you are allowed to remove the collar for cleaning and bathing, wash and dry the skin of your neck. Check your skin for irritation or sores. If you see any, tell your health care provider.  Do not drive while wearing the collar.   Only take over-the-counter or prescription medicines for pain, discomfort, or fever as directed by your health care provider.   Keep all follow-up appointments as directed by your health care provider.   Keep all physical therapy appointments as directed by your health care provider.   Make any needed adjustments to your workstation to promote good posture.   Avoid positions and activities that make your symptoms worse.   Warm up and stretch before being active to help prevent problems.  SEEK MEDICAL CARE IF:   Your pain is not controlled with medicine.   You are unable to decrease your pain medicine over time as planned.   Your activity level is not improving as expected.  SEEK IMMEDIATE MEDICAL CARE IF:   You develop any bleeding.  You develop stomach upset.  You have signs of an allergic reaction to your medicine.   Your symptoms get worse.   You develop new, unexplained symptoms.   You have numbness, tingling, weakness, or paralysis in any part of your body.  MAKE SURE YOU:   Understand these instructions.  Will watch your condition.  Will get help right away if you are not doing well or get worse. Document Released: 11/19/2006 Document Revised: 01/27/2013 Document  Reviewed: 07/30/2012 [facility] Patient Information 2015 [place], [place]. This information is not intended to replace advice given to you by your health care provider. Make sure you discuss any questions you have with your health care provider.

## 2014-10-21 DIAGNOSIS — Z23 Encounter for immunization: Secondary | ICD-10-CM | POA: Diagnosis not present

## 2014-11-03 DIAGNOSIS — E781 Pure hyperglyceridemia: Secondary | ICD-10-CM | POA: Diagnosis not present

## 2014-11-03 DIAGNOSIS — E559 Vitamin D deficiency, unspecified: Secondary | ICD-10-CM | POA: Diagnosis not present

## 2014-11-03 DIAGNOSIS — Z79899 Other long term (current) drug therapy: Secondary | ICD-10-CM | POA: Diagnosis not present

## 2014-11-03 DIAGNOSIS — D126 Benign neoplasm of colon, unspecified: Secondary | ICD-10-CM | POA: Diagnosis not present

## 2014-11-03 DIAGNOSIS — F419 Anxiety disorder, unspecified: Secondary | ICD-10-CM | POA: Diagnosis not present

## 2014-11-12 ENCOUNTER — Telehealth: Payer: Self-pay | Admitting: Internal Medicine

## 2014-11-12 NOTE — Telephone Encounter (Signed)
Received [facility] records and placed on Dr. Blanch Media desk for review.

## 2014-11-30 NOTE — Telephone Encounter (Signed)
Dr. Henrene Pastor accepted patient. Ok to schedule OV. Left message for patient to return my call.

## 2014-12-16 ENCOUNTER — Encounter: Payer: Self-pay | Admitting: Internal Medicine

## 2014-12-16 NOTE — Telephone Encounter (Signed)
Office visit scheduled for 02/24/15 with Dr. Henrene Pastor

## 2015-02-09 DIAGNOSIS — G4700 Insomnia, unspecified: Secondary | ICD-10-CM | POA: Diagnosis not present

## 2015-02-09 DIAGNOSIS — F411 Generalized anxiety disorder: Secondary | ICD-10-CM | POA: Diagnosis not present

## 2015-02-23 ENCOUNTER — Encounter: Payer: Self-pay | Admitting: Internal Medicine

## 2015-02-24 ENCOUNTER — Ambulatory Visit (INDEPENDENT_AMBULATORY_CARE_PROVIDER_SITE_OTHER): Payer: Medicare Other | Admitting: Internal Medicine

## 2015-02-24 ENCOUNTER — Encounter: Payer: Self-pay | Admitting: Internal Medicine

## 2015-02-24 VITALS — BP 142/74 | HR 68 | Ht 63.0 in | Wt 153.2 lb

## 2015-02-24 DIAGNOSIS — K5732 Diverticulitis of large intestine without perforation or abscess without bleeding: Secondary | ICD-10-CM | POA: Diagnosis not present

## 2015-02-24 DIAGNOSIS — Z1211 Encounter for screening for malignant neoplasm of colon: Secondary | ICD-10-CM

## 2015-02-24 DIAGNOSIS — Z86010 Personal history of colonic polyps: Secondary | ICD-10-CM

## 2015-02-24 DIAGNOSIS — R1032 Left lower quadrant pain: Secondary | ICD-10-CM | POA: Diagnosis not present

## 2015-02-24 MED ORDER — NA SULFATE-K SULFATE-MG SULF 17.5-3.13-1.6 GM/177ML PO SOLN: 1.0000 | Freq: Once | ORAL | Status: DC

## 2015-02-24 NOTE — Progress Notes (Signed)
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 Throughout the course of the interview patient firmly expresses displeasure with MULTIPLE physician healthcare providers and multiple different areas. I have solicited outside records from her most recent gastroenterologist, Dr. Oletta Lamas. As well, reviewed notes from her primary care provider Dr. Rex Kras as well as reviewed the EPIC record. She is accompanied today by her husband. The patient reports to me that she underwent colonoscopy with Dr. Collene Mares remotely. She tells me that the procedure was complicated by severe pain secondary to retained air for which she sought medical attention. I do not have that colonoscopy report. I do see surgical pathology from 2002 revealing that a colon polyp was a tubular adenoma. She also had problems with recurrent left lower quadrant pain that was eventually felt to be secondary to recalcitrant diverticulitis and underwent sigmoid colon resection with Dr. Zella Richer 12/10/2006. The surgical pathology revealed diverticulitis and benign lymph nodes. Thereafter she has had intermittent problems with left lower quadrant pain. Evaluations have not yielded any acute process. CT scan in 2013 and again in 2014 revealed mild diverticular change without diverticulitis or other abnormality. The pain has been felt to be secondary to adhesions. She reports generally regular bowel movements with good hydration. Occasional MiraLAX used when traveling. No recent problems with pain. Her primary care physician recommended colonoscopy as it has been about 10 years. She has multiple questions regarding possible procedure related complications, particularly in the setting of having had prior intestinal surgery. She also  has multiple questions regarding dietary recommendations. She has been avoiding seeds and nuts and popcorn to her chagrin. GI review of systems is otherwise negative  REVIEW OF SYSTEMS:  All non-GI ROS negative except for insomnia, arthritis, anxiety  Past Medical History  Diagnosis Date  . Anemia   . Arthritis     ddd  . Diverticulitis   . Vitamin D deficiency   . Hypertriglyceridemia   . Anxiety   . Colon polyps     adenomatous  . Cardiac arrhythmia   . Diverticulosis   . Pneumonia     Past Surgical History  Procedure Laterality Date  . Tonsillectomy and adenoidectomy    . Colectomy  2008    due to kink from popcorn kernal  . Meniscus repair Left     Social History Dawn Payne  reports that she has never smoked. She has never used smokeless tobacco. She reports that she drinks alcohol. She reports that she does not use illicit drugs.  family history includes Arthritis in her sister; Heart disease in her mother; Hypertension in her mother; Kidney disease in her mother; Prostate cancer in her father.  Allergies  Allergen Reactions  . Sulfonamide Derivatives Other (See Comments)    Pt states she turns bright red       PHYSICAL EXAMINATION: Vital signs: BP 142/74 mmHg  Pulse 68  Ht 5\' 3"  (1.6 m)  Wt 153 lb 4 oz (69.514 kg)  BMI 27.15 kg/m2  Constitutional: generally well-appearing, no acute distress Psychiatric: alert and oriented x3, cooperative, intense Eyes: extraocular movements intact, anicteric, conjunctiva pink Mouth: oral pharynx moist, no lesions Neck: supple without thyromegaly Lymph: no supraclavicular lymphadenopathy Cardiovascular: heart regular rate and rhythm, no murmur Lungs: clear to auscultation bilaterally Abdomen: soft, nontender, nondistended, no obvious  ascites, no peritoneal signs, normal bowel sounds, no organomegaly. Prior surgical incisions well-healed Rectal: Deferred until colonoscopy Extremities: no clubbing, cyanosis, or lower  extremity edema bilaterally Skin: no lesions on visible extremities Neuro: No focal deficits.   ASSESSMENT:  #1. History of complicated diverticulitis status post sigmoid resection November 2008 #2. Intermittent problems with left lower quadrant pain postoperatively. Has been worked up. Felt likely secondary to adhesions #3. Personal history of adenomatous colon polyp. Overdue for follow-up #4. Residual diverticulosis. Questions regarding diet   PLAN:  #1. Surveillance colonoscopy. Great detail spent discussing the process.The nature of the procedure, as well as the risks, benefits, and alternatives were carefully and thoroughly reviewed with the patient. Ample time for discussion and questions allowed. The patient understood, was satisfied, and agreed to proceed. #2. Discussion on adhesive-related abdominal pain #3. Discussion on diverticular disease. Also discussed that the most current evidence does not suggest that food avoidance pattern plays an important role in producing diverticular related complications. From my standpoint, she could liberalize her diet if she wishes. I will leave this up to her as she is informed

## 2015-02-24 NOTE — Patient Instructions (Signed)
You have been scheduled for a colonoscopy. Please follow written instructions given to you at your visit today.  Please pick up your prep supplies at the pharmacy within the next 1-3 days. If you use inhalers (even only as needed), please bring them with you on the day of your procedure.   

## 2015-03-08 NOTE — Telephone Encounter (Signed)
Left message to call back. I looked in paper chart. I did not see any records related to her knee, but I did see office notes regarding diverticulitis, abdominal CT, and a colonoscopy report. There are also reports from [facility] and [facility] regarding diverticulitis. Need clarification if these are the records she needs.

## 2015-05-03 ENCOUNTER — Encounter: Payer: Medicare Other | Admitting: Internal Medicine

## 2015-05-26 DIAGNOSIS — G5761 Lesion of plantar nerve, right lower limb: Secondary | ICD-10-CM | POA: Diagnosis not present

## 2015-05-26 DIAGNOSIS — T148 Other injury of unspecified body region: Secondary | ICD-10-CM | POA: Diagnosis not present

## 2015-06-03 ENCOUNTER — Ambulatory Visit: Payer: Medicare Other | Admitting: Podiatry

## 2015-06-06 ENCOUNTER — Encounter: Payer: Self-pay | Admitting: Podiatry

## 2015-06-06 ENCOUNTER — Ambulatory Visit (INDEPENDENT_AMBULATORY_CARE_PROVIDER_SITE_OTHER): Payer: Medicare Other

## 2015-06-06 ENCOUNTER — Ambulatory Visit (INDEPENDENT_AMBULATORY_CARE_PROVIDER_SITE_OTHER): Payer: Medicare Other | Admitting: Podiatry

## 2015-06-06 VITALS — BP 138/78 | HR 72 | Resp 16 | Ht 64.0 in | Wt 143.0 lb

## 2015-06-06 DIAGNOSIS — M79671 Pain in right foot: Secondary | ICD-10-CM

## 2015-06-06 DIAGNOSIS — M779 Enthesopathy, unspecified: Secondary | ICD-10-CM

## 2015-06-06 MED ORDER — TRIAMCINOLONE ACETONIDE 10 MG/ML IJ SUSP
10.0000 mg | Freq: Once | INTRAMUSCULAR | Status: AC
  Administered 2015-06-06: 10 mg

## 2015-06-06 NOTE — Progress Notes (Signed)
   Subjective:    Patient ID: Dawn Payne, female    DOB: [birth year], 70 y.o.   MRN: [id number]  HPI Chief Complaint  Patient presents with  . Foot Pain    Right foot; 3rd toe; pt stated, "Went dancing and when pivoted foot. foot was sore; was told by doctor had a stress fracture on 3rd toe; also has neuroma on ball of foot"      Review of Systems  All other systems reviewed and are negative.      Objective:   Physical Exam        Assessment & Plan:

## 2015-06-08 NOTE — Progress Notes (Signed)
Subjective:     Patient ID: Dawn Payne, female   DOB: Jan 14, 1946, 70 y.o.   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 Patient thought she had a stress fracture but is not appear to be acting that well   Review of Systems  All other systems reviewed and are negative.      Objective:   Physical Exam  Constitutional: She is oriented to person, place, and time.  Cardiovascular: Intact distal pulses.   Musculoskeletal: Normal range of motion.  Neurological: She is oriented to person, place, and time.  Skin: Skin is warm.  Nursing note and vitals reviewed.  neurovascular status intact muscle strength adequate range of motion within normal limits with patient found to have exquisite discomfort right third metatarsophalangeal joint with fluid buildup within the joint surface. I did not note pain in the metatarsal shaft itself or in the digit but this area around the third metatarsophalangeal joint is very tender when pressed. Patient has good digital perfusion and is well oriented 3     Assessment:     Inflammatory capsulitis third MPJ right with fluid buildup around the joint surface    Plan:     H&P and condition reviewed and x-rays reviewed with patient. Today I discussed injection treatment and I did discuss the risk of this and patient wants the procedure and today I did a proximal nerve block aspirated the third MPJ I was able to get out a small amount of clear fluid and injected with a quarter cc of dexamethasone Kenalog. I applied thick plantar pad to reduce pressure against the joint and reappoint again in the next several weeks and may require orthotic therapy  X-ray report indicated that there is no indications of stress fracture arthritis at the current time

## 2015-06-14 ENCOUNTER — Encounter: Payer: Medicare Other | Admitting: Internal Medicine

## 2015-06-20 ENCOUNTER — Ambulatory Visit: Payer: Medicare Other | Admitting: Podiatry

## 2015-06-20 ENCOUNTER — Encounter: Payer: Self-pay | Admitting: Podiatry

## 2015-06-20 DIAGNOSIS — M779 Enthesopathy, unspecified: Secondary | ICD-10-CM

## 2015-06-20 DIAGNOSIS — M79671 Pain in right foot: Secondary | ICD-10-CM | POA: Diagnosis not present

## 2015-06-20 NOTE — Progress Notes (Signed)
Subjective:     Patient ID: Dawn Payne, female   DOB: 09-30-1945, 70 y.o.   MRN: [id number]  HPI patient states the pain is improved but still present on the right foot. I know I need orthotics   Review of Systems     Objective:   Physical Exam Neurovascular status intact muscle strength adequate with discomfort in the right forefoot of a mild to moderate nature but significantly improved from previous visit with discomfort still noted in the metatarsophalangeal joint of a mild to moderate nature    Assessment:     Inflammatory capsulitis still present but improved    Plan:     Recommended continued pad usage and scanned for custom orthotics to try to reduce plantar stress with specific pad to be made around the second metatarsal. Patient will be seen back when those are ready

## 2015-06-28 ENCOUNTER — Ambulatory Visit: Payer: Medicare Other | Admitting: *Deleted

## 2015-06-28 DIAGNOSIS — M779 Enthesopathy, unspecified: Secondary | ICD-10-CM

## 2015-06-28 NOTE — Patient Instructions (Signed)

## 2015-06-28 NOTE — Progress Notes (Signed)
Patient ID: Dawn Payne, female   DOB: 01-10-1946, 70 y.o.   MRN: [id number] Patient presents for orthotic pick up.  Patient does not like the feel or fit.  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

## 2015-07-14 ENCOUNTER — Encounter: Payer: Self-pay | Admitting: Podiatry

## 2015-07-14 ENCOUNTER — Ambulatory Visit (INDEPENDENT_AMBULATORY_CARE_PROVIDER_SITE_OTHER): Payer: Medicare Other | Admitting: Podiatry

## 2015-07-14 DIAGNOSIS — M779 Enthesopathy, unspecified: Secondary | ICD-10-CM | POA: Diagnosis not present

## 2015-07-14 NOTE — Progress Notes (Signed)
Subjective:     Patient ID: Dawn Payne, female   DOB: [birth year], 70 y.o.   MRN: [id number]  HPI patient states I still gets some pain in my right foot if I'm on it a lot and the pad seemed to help and I'm not sure how to use them exactly   Review of Systems     Objective:   Physical Exam Neurovascular status intact muscle strength adequate with discomfort in the right third MPJ which is minimal in nature and not intense with no fluid buildup or warmth to the joint surface    Assessment:     Doing well right foot with inflammatory capsulitis of a mild nature noted    Plan:     We are going to change her orthotics and she will continue with metatarsal pads to reduce pressure and she'll be seen back as needed and we also discussed possible reflexology for this patient

## 2015-07-27 ENCOUNTER — Ambulatory Visit (AMBULATORY_SURGERY_CENTER): Payer: Self-pay | Admitting: *Deleted

## 2015-07-27 VITALS — Ht 63.75 in | Wt 153.0 lb

## 2015-07-27 DIAGNOSIS — Z1211 Encounter for screening for malignant neoplasm of colon: Secondary | ICD-10-CM

## 2015-07-27 NOTE — Progress Notes (Addendum)
No egg or soy allergy known to patient  No issues with past sedation with any surgeries  or procedures, no intubation problems  No diet pills per patient No home 02 use per patient  No blood thinners per patient  Pt denies issues with constipation  emmi declined Pt states we need to use a pediatric scope on her- this is all that has been used- also need to start IV with a small IV needle  Pt has a suprep in [place] today from [place] 02-2015 Pt states she was given instructions in January for her prep. She states she was told she could do the 2nd bottle around midnight so she did not have to get up early ( 430 am ) to do her prep.  I informed her that protocol was 5 hours before the procedure, I was not sure why she was told that from 3rd floor. She states she was also told she was not going to like the taste of the suprep, so she could buy miralax and gatorade over the counter to use as the 2nd half of her prep and not use the 2nd bottle. I informed her of things to do to help with the taste of her prep but instructed her on taking BOTH bottles of suprep, and high lighted to after hours number to use if necessary. I told her to follow the prep instructions as given, to do bottle 2 at 430 am and complete ALL liquids by 630 am and nothing after that. She verbalized understanding in [place] today

## 2015-07-28 DIAGNOSIS — H25013 Cortical age-related cataract, bilateral: Secondary | ICD-10-CM | POA: Diagnosis not present

## 2015-07-28 DIAGNOSIS — H2513 Age-related nuclear cataract, bilateral: Secondary | ICD-10-CM | POA: Diagnosis not present

## 2015-08-02 ENCOUNTER — Ambulatory Visit (AMBULATORY_SURGERY_CENTER): Payer: Medicare Other | Admitting: Internal Medicine

## 2015-08-02 ENCOUNTER — Encounter: Payer: Self-pay | Admitting: Internal Medicine

## 2015-08-02 ENCOUNTER — Telehealth: Payer: Self-pay | Admitting: Gastroenterology

## 2015-08-02 VITALS — BP 146/69 | HR 61 | Temp 98.6°F | Resp 11 | Ht 63.75 in | Wt 153.0 lb

## 2015-08-02 DIAGNOSIS — R1032 Left lower quadrant pain: Secondary | ICD-10-CM | POA: Diagnosis not present

## 2015-08-02 DIAGNOSIS — Z86010 Personal history of colonic polyps: Secondary | ICD-10-CM

## 2015-08-02 DIAGNOSIS — D649 Anemia, unspecified: Secondary | ICD-10-CM | POA: Diagnosis not present

## 2015-08-02 DIAGNOSIS — Z1211 Encounter for screening for malignant neoplasm of colon: Secondary | ICD-10-CM

## 2015-08-02 DIAGNOSIS — D122 Benign neoplasm of ascending colon: Secondary | ICD-10-CM | POA: Diagnosis not present

## 2015-08-02 MED ORDER — SODIUM CHLORIDE 0.9 % IV SOLN: 500.0000 mL | INTRAVENOUS | Status: DC

## 2015-08-02 NOTE — Telephone Encounter (Signed)
Pt called at 1830 on 6/26 complaining of nausea and bad taste of colonoscopy prep. Wanted to take sips of Coca Cola and Zofran 4mg . I OK'd both. Pts husband called at 0610 on 6/27 saying pts nausea returned and wanted to take Zofran 8mg  which I OK'd.

## 2015-08-02 NOTE — Patient Instructions (Signed)
Discharge instructions given. Handouts on polyps,diverticulosis and hemorrhoids. Resume previous medications. YOU HAD AN ENDOSCOPIC PROCEDURE TODAY AT THE [facility]:   Refer to the procedure report that was given to you for any specific questions about what was found during the examination.  If the procedure report does not answer your questions, please call your gastroenterologist to clarify.  If you requested that your care partner not be given the details of your procedure findings, then the procedure report has been included in a sealed envelope for you to review at your convenience later.  YOU SHOULD EXPECT: Some feelings of bloating in the abdomen. Passage of more gas than usual.  Walking can help get rid of the air that was put into your GI tract during the procedure and reduce the bloating. If you had a lower endoscopy (such as a colonoscopy or flexible sigmoidoscopy) you may notice spotting of blood in your stool or on the toilet paper. If you underwent a bowel prep for your procedure, you may not have a normal bowel movement for a few days.  Please Note:  You might notice some irritation and congestion in your nose or some drainage.  This is from the oxygen used during your procedure.  There is no need for concern and it should clear up in a day or so.  SYMPTOMS TO REPORT IMMEDIATELY:   Following lower endoscopy (colonoscopy or flexible sigmoidoscopy):  Excessive amounts of blood in the stool  Significant tenderness or worsening of abdominal pains  Swelling of the abdomen that is new, acute  Fever of 100F or higher   For urgent or emergent issues, a gastroenterologist can be reached at any hour by calling [phone].   DIET: Your first meal following the procedure should be a small meal and then it is ok to progress to your normal diet. Heavy or fried foods are harder to digest and may make you feel nauseous or bloated.  Likewise, meals heavy in dairy and  vegetables can increase bloating.  Drink plenty of fluids but you should avoid alcoholic beverages for 24 hours.  ACTIVITY:  You should plan to take it easy for the rest of today and you should NOT DRIVE or use heavy machinery until tomorrow (because of the sedation medicines used during the test).    FOLLOW UP: Our staff will call the number listed on your records the next business day following your procedure to check on you and address any questions or concerns that you may have regarding the information given to you following your procedure. If we do not reach you, we will leave a message.  However, if you are feeling well and you are not experiencing any problems, there is no need to return our call.  We will assume that you have returned to your regular daily activities without incident.  If any biopsies were taken you will be contacted by phone or by letter within the next 1-3 weeks.  Please call us at [phone] if you have not heard about the biopsies in 3 weeks.    SIGNATURES/CONFIDENTIALITY: You and/or your care partner have signed paperwork which will be entered into your electronic medical record.  These signatures attest to the fact that that the information above on your After Visit Summary has been reviewed and is understood.  Full responsibility of the confidentiality of this discharge information lies with you and/or your care-partner. 

## 2015-08-02 NOTE — Progress Notes (Signed)
Called to room to assist during endoscopic procedure.  Patient ID and intended procedure confirmed with present staff. Received instructions for my participation in the procedure from the performing physician.  

## 2015-08-02 NOTE — Progress Notes (Signed)
To recovery, report to McCoy, RN, VSS 

## 2015-08-02 NOTE — Op Note (Signed)
[facility] Patient Name: Dawn Payne Procedure Date: 08/02/2015 9:46 AM MRN: [id number] Endoscopist: Docia Chuck. Henrene Pastor , MD Age: 70 Referring MD:  Date of Birth: [birth year] Gender: Female Account #: [id number] Procedure:                Colonoscopy, with cold snare polypectomy X 1 Indications:              High risk colon cancer surveillance: Personal                            history of non-advanced adenoma (Dr. Collene Mares 2002).                            Status post sigmoid colon resection for complicated                            diverticular disease November 2008 Medicines:                Monitored Anesthesia Care Procedure:                Pre-Anesthesia Assessment:                           - Prior to the procedure, a History and Physical                            was performed, and patient medications and                            allergies were reviewed. The patient's tolerance of                            previous anesthesia was also reviewed. The risks                            and benefits of the procedure and the sedation                            options and risks were discussed with the patient.                            All questions were answered, and informed consent                            was obtained. Prior Anticoagulants: The patient has                            taken no previous anticoagulant or antiplatelet                            agents. ASA Grade Assessment: II - A patient with                            mild systemic disease. After reviewing the risks  and benefits, the patient was deemed in                            satisfactory condition to undergo the procedure.                           After obtaining informed consent, the colonoscope                            was passed under direct vision. Throughout the                            procedure, the patient's blood pressure, pulse, and   oxygen saturations were monitored continuously. The                            Model CF-HQ190L [id number]) scope was introduced                            through the anus and advanced to the the cecum,                            identified by appendiceal orifice and ileocecal                            valve. The ileocecal valve, appendiceal orifice,                            and rectum were photographed. The quality of the                            bowel preparation was excellent. The colonoscopy                            was performed without difficulty. The patient                            tolerated the procedure well. The bowel preparation                            used was SUPREP. Scope In: 9:55:28 AM Scope Out: 10:07:57 AM Scope Withdrawal Time: 0 hours 10 minutes 50 seconds  Total Procedure Duration: 0 hours 12 minutes 29 seconds  Findings:                 A 5 mm polyp was found in the ascending colon. The                            polyp was removed with a cold snare. Resection and                            retrieval were complete.                           Scattered diverticula were found  in the left colon                            and right colon. There is evidence of prior sigmoid                            colon resection and unremarkable anastomosis at 20                            cm.                           The exam was otherwise without abnormality on                            direct and retroflexion views. Small internal                            hemorrhoids noted. Complications:            No immediate complications. Estimated blood loss:                            None. Estimated Blood Loss:     Estimated blood loss: none. Impression:               - One 5 mm polyp in the ascending colon, removed                            with a cold snare. Resected and retrieved.                           - Diverticulosis in the left colon and in the right                             colon. Status post sigmoid resection with primary                            anastomosis.                           - The examination was otherwise normal on direct                            and retroflexion views. Recommendation:           - Repeat colonoscopy in 5 years for surveillance.                           - Patient has a contact number available for                            emergencies. The signs and symptoms of potential                            delayed complications were discussed with the  patient. Return to normal activities tomorrow.                            Written discharge instructions were provided to the                            patient.                           - Resume previous diet.                           - Continue present medications.                           - Await pathology results. Docia Chuck. Henrene Pastor, MD 08/02/2015 10:14:05 AM This report has been signed electronically. CC Letter to:             Hulan Fess MD

## 2015-08-03 ENCOUNTER — Telehealth: Payer: Self-pay

## 2015-08-03 NOTE — Telephone Encounter (Signed)
  Follow up Call-  Call back number 08/02/2015  Post procedure Call Back phone  # [phone]  Permission to leave phone message Yes     Patient questions:  Do you have a fever, pain , or abdominal swelling? No. Pain Score  0 *  Have you tolerated food without any problems? Yes.    Have you been able to return to your normal activities? Yes.    Do you have any questions about your discharge instructions: Diet   No. Medications  No. Follow up visit  No.  Do you have questions or concerns about your Care? No.  Actions: * If pain score is 4 or above: No action needed, pain <4.

## 2015-08-04 ENCOUNTER — Encounter: Payer: Self-pay | Admitting: Internal Medicine

## 2015-08-12 ENCOUNTER — Ambulatory Visit (INDEPENDENT_AMBULATORY_CARE_PROVIDER_SITE_OTHER): Payer: Medicare Other | Admitting: Podiatry

## 2015-08-12 DIAGNOSIS — M779 Enthesopathy, unspecified: Secondary | ICD-10-CM

## 2015-08-12 NOTE — Progress Notes (Deleted)
Pt presents to pick up refinished orthotics, orthotics with instructions were dispensed. She is to follow up with any questions or concerns

## 2015-08-12 NOTE — Patient Instructions (Signed)

## 2015-09-27 DIAGNOSIS — H25811 Combined forms of age-related cataract, right eye: Secondary | ICD-10-CM | POA: Diagnosis not present

## 2015-09-27 DIAGNOSIS — H25011 Cortical age-related cataract, right eye: Secondary | ICD-10-CM | POA: Diagnosis not present

## 2015-09-27 DIAGNOSIS — H2511 Age-related nuclear cataract, right eye: Secondary | ICD-10-CM | POA: Diagnosis not present

## 2015-11-01 DIAGNOSIS — F411 Generalized anxiety disorder: Secondary | ICD-10-CM | POA: Diagnosis not present

## 2015-11-01 DIAGNOSIS — G4700 Insomnia, unspecified: Secondary | ICD-10-CM | POA: Diagnosis not present

## 2015-11-14 ENCOUNTER — Ambulatory Visit (INDEPENDENT_AMBULATORY_CARE_PROVIDER_SITE_OTHER): Payer: Medicare Other | Admitting: Family Medicine

## 2015-11-14 VITALS — BP 118/64 | HR 81 | Temp 98.1°F | Resp 16 | Ht 63.0 in | Wt 156.8 lb

## 2015-11-14 DIAGNOSIS — J069 Acute upper respiratory infection, unspecified: Secondary | ICD-10-CM

## 2015-11-14 DIAGNOSIS — B9789 Other viral agents as the cause of diseases classified elsewhere: Secondary | ICD-10-CM | POA: Diagnosis not present

## 2015-11-14 MED ORDER — FLUTICASONE PROPIONATE 50 MCG/ACT NA SUSP: 2.0000 | Freq: Two times a day (BID) | NASAL | 6 refills | Status: DC

## 2015-11-14 MED ORDER — GUAIFENESIN-CODEINE 100-10 MG/5ML PO SOLN: 5.0000 mL | Freq: Two times a day (BID) | ORAL | 0 refills | Status: DC | PRN

## 2015-11-14 NOTE — Progress Notes (Signed)
Chief Complaint  Patient presents with  . Cough    x 1 week    HPI  Pt reports that she has been coughing for one week She reports that her husband was treated for bronchitis  She reports that she been coughing yellow mucus No fevers  No posttussive emesis She gets chest soreness from the coughing Her husband hears wheezing  No history of asthma or tobacco  Past Medical History:  Diagnosis Date  . Anemia   . Anxiety    pt denies at this time 07-27-15  . Arthritis    ddd  . Cardiac arrhythmia    palpitation  . Colon polyps    adenomatous  . Diverticulitis   . Diverticulosis   . Hypertriglyceridemia   . Pneumonia   . Vitamin D deficiency     Current Outpatient Prescriptions  Medication Sig Dispense Refill  . ALPRAZolam (XANAX) 0.5 MG tablet Take 0.5 mg by mouth at bedtime as needed for sleep.    . ergocalciferol (VITAMIN D2) 50000 UNITS capsule Take 50,000 Units by mouth once a week.    Marland Kitchen OVER THE COUNTER MEDICATION Take 1 capsule by mouth daily. Move free joint supplement    . Probiotic Product (PROBIOTIC DAILY PO) Take 2 tablets by mouth daily.    . vitamin C (ASCORBIC ACID) 500 MG tablet Take 500 mg by mouth daily. [phone] mg a day    . B Complex Vitamins (VITAMIN-B COMPLEX PO) Take 1 tablet by mouth daily. Reported on 07/27/2015    . dimenhyDRINATE (DRAMAMINE) 50 MG tablet Take 50 mg by mouth as needed. Reported on 07/27/2015    . fluticasone (FLONASE) 50 MCG/ACT nasal spray Place 2 sprays into both nostrils 2 (two) times daily. 16 g 6  . guaiFENesin-codeine 100-10 MG/5ML syrup Take 5 mLs by mouth 3 times/day as needed-between meals & bedtime for cough. 60 mL 0   No current facility-administered medications for this visit.     Allergy:  Allergies  Allergen Reactions  . Sulfonamide Derivatives Other (See Comments)    Pt states she turns bright red     Past Surgical History:  Procedure Laterality Date  . COLECTOMY  2008   due to kink from popcorn kernal    . COLONOSCOPY    . MENISCUS REPAIR Left   . TONSILLECTOMY AND ADENOIDECTOMY      Social History   Social History  . Marital status: Married    Spouse name: N/A  . Number of children: 0  . Years of education: N/A   Occupational History  . real estate    Social History Main Topics  . Smoking status: Never Smoker  . Smokeless tobacco: Never Used  . Alcohol use 0.0 oz/week     Comment: only on social ocassion  . Drug use: No  . Sexual activity: Yes   Other Topics Concern  . None   Social History Narrative  . None    ROS  Objective: Vitals:   11/14/15 1346  BP: 118/64  Pulse: 81  Resp: 16  Temp: 98.1 F (36.7 C)  TempSrc: Oral  SpO2: 97%  Weight: 156 lb 12.8 oz (71.1 kg)  Height: 5\' 3"  (1.6 m)    Physical Exam  General: alert, oriented, in NAD Head: normocephalic, atraumatic, no sinus tenderness Eyes: EOM intact, no scleral icterus or conjunctival injection Ears: TM clear bilaterally Throat: no pharyngeal exudate or erythema Lymph: no posterior auricular, submental or cervical lymph adenopathy Heart: normal rate, normal sinus  rhythm, no murmurs Lungs: clear to auscultation bilaterally, no wheezing   Assessment and Plan Kylenn was seen today for cough.  Diagnoses and all orders for this visit:  Viral URI  Advised supportive care Cough medication given with precautions Discussed that abx is not warranted at this time  -     fluticasone (FLONASE) 50 MCG/ACT nasal spray; Place 2 sprays into both nostrils 2 (two) times daily. -     guaiFENesin-codeine 100-10 MG/5ML syrup; Take 5 mLs by mouth 3 times/day as needed-between meals & bedtime for cough.     [place]

## 2015-11-14 NOTE — Patient Instructions (Addendum)
IF you received an x-ray today, you will receive an invoice from [facility]. Please contact [facility] at [phone] with questions or concerns regarding your invoice.   IF you received labwork today, you will receive an invoice from [facility]. Please contact [facility] at [phone] with questions or concerns regarding your invoice.   Our billing staff will not be able to assist you with questions regarding bills from these companies.  You will be contacted with the lab results as soon as they are available. The fastest way to get your results is to activate your My Chart account. Instructions are located on the last page of this paperwork. If you have not heard from us regarding the results in 2 weeks, please contact this office.   Upper Respiratory Infection, Adult Most upper respiratory infections (URIs) are a viral infection of the air passages leading to the lungs. A URI affects the nose, throat, and upper air passages. The most common type of URI is nasopharyngitis and is typically referred to as "the common cold." URIs run their course and usually go away on their own. Most of the time, a URI does not require medical attention, but sometimes a bacterial infection in the upper airways can follow a viral infection. This is called a secondary infection. Sinus and middle ear infections are common types of secondary upper respiratory infections. Bacterial pneumonia can also complicate a URI. A URI can worsen asthma and chronic obstructive pulmonary disease (COPD). Sometimes, these complications can require emergency medical care and may be life threatening.  CAUSES Almost all URIs are caused by viruses. A virus is a type of germ and can spread from one person to another.  RISKS FACTORS You may be at risk for a URI if:   You smoke.   You have chronic heart or lung disease.  You have a weakened defense (immune) system.   You are very young  or very old.   You have nasal allergies or asthma.  You work in crowded or poorly ventilated areas.  You work in health care facilities or schools. SIGNS AND SYMPTOMS  Symptoms typically develop 2-3 days after you come in contact with a cold virus. Most viral URIs last 7-10 days. However, viral URIs from the influenza virus (flu virus) can last 14-18 days and are typically more severe. Symptoms may include:   Runny or stuffy (congested) nose.   Sneezing.   Cough.   Sore throat.   Headache.   Fatigue.   Fever.   Loss of appetite.   Pain in your forehead, behind your eyes, and over your cheekbones (sinus pain).  Muscle aches.  DIAGNOSIS  Your health care provider may diagnose a URI by:  Physical exam.  Tests to check that your symptoms are not due to another condition such as:  Strep throat.  Sinusitis.  Pneumonia.  Asthma. TREATMENT  A URI goes away on its own with time. It cannot be cured with medicines, but medicines may be prescribed or recommended to relieve symptoms. Medicines may help:  Reduce your fever.  Reduce your cough.  Relieve nasal congestion. HOME CARE INSTRUCTIONS   Take medicines only as directed by your health care provider.   Gargle warm saltwater or take cough drops to comfort your throat as directed by your health care provider.  Use a warm mist humidifier or inhale steam from a shower to increase air moisture. This may make it easier to breathe.  Drink enough fluid to keep your   urine clear or pale yellow.   Eat soups and other clear broths and maintain good nutrition.   Rest as needed.   Return to work when your temperature has returned to normal or as your health care provider advises. You may need to stay home longer to avoid infecting others. You can also use a face mask and careful hand washing to prevent spread of the virus.  Increase the usage of your inhaler if you have asthma.   Do not use any tobacco  products, including cigarettes, chewing tobacco, or electronic cigarettes. If you need help quitting, ask your health care provider. PREVENTION  The best way to protect yourself from getting a cold is to practice good hygiene.   Avoid oral or hand contact with people with cold symptoms.   Wash your hands often if contact occurs.  There is no clear evidence that vitamin C, vitamin E, echinacea, or exercise reduces the chance of developing a cold. However, it is always recommended to get plenty of rest, exercise, and practice good nutrition.  SEEK MEDICAL CARE IF:   You are getting worse rather than better.   Your symptoms are not controlled by medicine.   You have chills.  You have worsening shortness of breath.  You have brown or red mucus.  You have yellow or brown nasal discharge.  You have pain in your face, especially when you bend forward.  You have a fever.  You have swollen neck glands.  You have pain while swallowing.  You have white areas in the back of your throat. SEEK IMMEDIATE MEDICAL CARE IF:   You have severe or persistent:  Headache.  Ear pain.  Sinus pain.  Chest pain.  You have chronic lung disease and any of the following:  Wheezing.  Prolonged cough.  Coughing up blood.  A change in your usual mucus.  You have a stiff neck.  You have changes in your:  Vision.  Hearing.  Thinking.  Mood. MAKE SURE YOU:   Understand these instructions.  Will watch your condition.  Will get help right away if you are not doing well or get worse.   This information is not intended to replace advice given to you by your health care provider. Make sure you discuss any questions you have with your health care provider.   Document Released: 07/18/2000 Document Revised: 06/08/2014 Document Reviewed: 04/29/2013 Elsevier Interactive Patient Education 2016 Elsevier Inc.  

## 2015-11-21 DIAGNOSIS — J329 Chronic sinusitis, unspecified: Secondary | ICD-10-CM | POA: Diagnosis not present

## 2015-11-29 DIAGNOSIS — J45909 Unspecified asthma, uncomplicated: Secondary | ICD-10-CM | POA: Diagnosis not present

## 2015-12-06 DIAGNOSIS — I517 Cardiomegaly: Secondary | ICD-10-CM | POA: Diagnosis not present

## 2015-12-06 DIAGNOSIS — J189 Pneumonia, unspecified organism: Secondary | ICD-10-CM | POA: Diagnosis not present

## 2015-12-06 DIAGNOSIS — J45909 Unspecified asthma, uncomplicated: Secondary | ICD-10-CM | POA: Diagnosis not present

## 2015-12-12 DIAGNOSIS — R0989 Other specified symptoms and signs involving the circulatory and respiratory systems: Secondary | ICD-10-CM | POA: Diagnosis not present

## 2015-12-12 DIAGNOSIS — R05 Cough: Secondary | ICD-10-CM | POA: Diagnosis not present

## 2015-12-13 ENCOUNTER — Telehealth: Payer: Self-pay | Admitting: Cardiology

## 2015-12-13 NOTE — Telephone Encounter (Signed)
Received records from [facility] for appointment on 01/09/16 with Dr Stanford Breed.  Records given to [facility] (medical records) for Dr Jacalyn Lefevre schedule on 01/09/16.  lp

## 2015-12-19 DIAGNOSIS — J9801 Acute bronchospasm: Secondary | ICD-10-CM | POA: Diagnosis not present

## 2015-12-19 DIAGNOSIS — J189 Pneumonia, unspecified organism: Secondary | ICD-10-CM | POA: Diagnosis not present

## 2015-12-19 DIAGNOSIS — R05 Cough: Secondary | ICD-10-CM | POA: Diagnosis not present

## 2015-12-28 NOTE — Progress Notes (Signed)
HPI: 70 year old female for evaluation of cardiomegaly. Patient saw Dr. Harrington Challenger in the past for palpitations but not since September 2014. She recently developed pneumonia. A chest x-ray showed cardiomegaly and cardiology asked to evaluate. She does not have significant dyspnea on exertion, orthopnea, PND, pedal edema, exertional chest pain or syncope. She does have occasional palpitations present for years. They are described as a brief skip or flutter but not sustained.  Current Outpatient Prescriptions  Medication Sig Dispense Refill  . ALPRAZolam (XANAX) 0.5 MG tablet Take 0.5 mg by mouth at bedtime as needed for sleep.    . B Complex Vitamins (VITAMIN-B COMPLEX PO) Take 1 tablet by mouth daily. Reported on 07/27/2015    . ergocalciferol (VITAMIN D2) 50000 UNITS capsule Take 50,000 Units by mouth once a week.    Marland Kitchen OVER THE COUNTER MEDICATION Take 1 capsule by mouth daily. Move free joint supplement    . Probiotic Product (PROBIOTIC DAILY PO) Take 2 tablets by mouth daily.    . vitamin C (ASCORBIC ACID) 500 MG tablet Take 500 mg by mouth daily. [phone] mg a day     No current facility-administered medications for this visit.     Allergies  Allergen Reactions  . Sulfonamide Derivatives Other (See Comments)    Pt states she turns bright red     Past Medical History:  Diagnosis Date  . Anemia   . Anxiety    pt denies at this time 07-27-15  . Arthritis    ddd  . Cardiac arrhythmia    palpitation  . Colon polyps    adenomatous  . Diverticulitis   . Diverticulosis   . Hypertriglyceridemia   . Pneumonia   . Vitamin D deficiency     Past Surgical History:  Procedure Laterality Date  . COLECTOMY  2008   due to kink from popcorn kernal  . COLONOSCOPY    . MENISCUS REPAIR Left   . TONSILLECTOMY AND ADENOIDECTOMY      Social History   Social History  . Marital status: Married    Spouse name: N/A  . Number of children: 0  . Years of education: N/A   Occupational  History  . real estate    Social History Main Topics  . Smoking status: Never Smoker  . Smokeless tobacco: Never Used  . Alcohol use 0.0 oz/week     Comment: only on social ocassion  . Drug use: No  . Sexual activity: Yes   Other Topics Concern  . Not on file   Social History Narrative  . No narrative on file    Family History  Problem Relation Age of Onset  . Hypertension Mother   . Kidney disease Mother   . Heart disease Mother   . Prostate cancer Father   . Arthritis Sister   . Colon cancer Neg Hx   . Colon polyps Neg Hx   . Esophageal cancer Neg Hx   . Rectal cancer Neg Hx   . Stomach cancer Neg Hx     ROS: no fevers or chills, productive cough, hemoptysis, dysphasia, odynophagia, melena, hematochezia, dysuria, hematuria, rash, seizure activity, orthopnea, PND, pedal edema, claudication. Remaining systems are negative.  Physical Exam:   Blood pressure (!) 154/72, pulse 67, height 5\' 3"  (1.6 m), weight 155 lb (70.3 kg).  General:  Well developed/well nourished in NAD Skin warm/dry Patient not depressed No peripheral clubbing Back-normal HEENT-normal/normal eyelids Neck supple/normal carotid upstroke bilaterally; no bruits; no JVD; no thyromegaly  chest - CTA/ normal expansion CV - RRR/normal S1 and S2; no murmurs, rubs or gallops;  PMI nondisplaced Abdomen -NT/ND, no HSM, no mass, + bowel sounds, no bruit 2+ femoral pulses, no bruits Ext-no edema, chords, 2+ DP Neuro-grossly nonfocal  ECG Sinus rhythm at a rate of 67. Normal axis. RV conduction delay. Occasional PAC.  A/P  1 cardiomegaly-no CHF symptoms. I will obtain an echocardiogram to assess LV size and function. If normal we will not pursue further cardiac evaluation.   2 hypertriglyceridemia-management per primary care.  3 palpitations-the sound likely to be PACs or PVCs. They're not particularly problematic. We can consider a beta blocker in the future if they worsen.   Kirk Ruths, MD

## 2016-01-02 ENCOUNTER — Encounter: Payer: Self-pay | Admitting: Cardiology

## 2016-01-09 ENCOUNTER — Encounter: Payer: Self-pay | Admitting: Cardiology

## 2016-01-09 ENCOUNTER — Ambulatory Visit (INDEPENDENT_AMBULATORY_CARE_PROVIDER_SITE_OTHER): Payer: Medicare Other | Admitting: Cardiology

## 2016-01-09 VITALS — BP 154/72 | HR 67 | Ht 63.0 in | Wt 155.0 lb

## 2016-01-09 DIAGNOSIS — R002 Palpitations: Secondary | ICD-10-CM | POA: Diagnosis not present

## 2016-01-09 DIAGNOSIS — I517 Cardiomegaly: Secondary | ICD-10-CM

## 2016-01-09 NOTE — Patient Instructions (Signed)
Medication Instructions:   NO CHANGE  Testing/Procedures:  Your physician has requested that you have an echocardiogram. Echocardiography is a painless test that uses sound waves to create images of your heart. It provides your doctor with information about the size and shape of your heart and how well your heart's chambers and valves are working. This procedure takes approximately one hour. There are no restrictions for this procedure.    Follow-Up:  Your physician recommends that you schedule a follow-up appointment AS NEEDED PENDING TEST RESULTS

## 2016-01-12 DIAGNOSIS — Z23 Encounter for immunization: Secondary | ICD-10-CM | POA: Diagnosis not present

## 2016-02-01 DIAGNOSIS — H6993 Unspecified Eustachian tube disorder, bilateral: Secondary | ICD-10-CM | POA: Diagnosis not present

## 2016-02-01 DIAGNOSIS — J329 Chronic sinusitis, unspecified: Secondary | ICD-10-CM | POA: Diagnosis not present

## 2016-02-10 ENCOUNTER — Other Ambulatory Visit (HOSPITAL_COMMUNITY): Payer: Medicare Other

## 2016-02-11 DIAGNOSIS — H6981 Other specified disorders of Eustachian tube, right ear: Secondary | ICD-10-CM | POA: Diagnosis not present

## 2016-02-16 ENCOUNTER — Other Ambulatory Visit (HOSPITAL_COMMUNITY): Payer: Medicare Other

## 2016-02-16 DIAGNOSIS — H902 Conductive hearing loss, unspecified: Secondary | ICD-10-CM | POA: Diagnosis not present

## 2016-02-16 DIAGNOSIS — J320 Chronic maxillary sinusitis: Secondary | ICD-10-CM | POA: Diagnosis not present

## 2016-02-16 DIAGNOSIS — J410 Simple chronic bronchitis: Secondary | ICD-10-CM | POA: Diagnosis not present

## 2016-02-16 DIAGNOSIS — B370 Candidal stomatitis: Secondary | ICD-10-CM | POA: Diagnosis not present

## 2016-02-16 DIAGNOSIS — H9313 Tinnitus, bilateral: Secondary | ICD-10-CM | POA: Diagnosis not present

## 2016-02-16 DIAGNOSIS — J322 Chronic ethmoidal sinusitis: Secondary | ICD-10-CM | POA: Diagnosis not present

## 2016-02-16 DIAGNOSIS — H6523 Chronic serous otitis media, bilateral: Secondary | ICD-10-CM | POA: Diagnosis not present

## 2016-02-16 DIAGNOSIS — H903 Sensorineural hearing loss, bilateral: Secondary | ICD-10-CM | POA: Diagnosis not present

## 2016-02-27 ENCOUNTER — Ambulatory Visit (HOSPITAL_COMMUNITY): Payer: Medicare Other | Attending: Cardiovascular Disease

## 2016-02-27 ENCOUNTER — Other Ambulatory Visit: Payer: Self-pay

## 2016-02-27 DIAGNOSIS — I081 Rheumatic disorders of both mitral and tricuspid valves: Secondary | ICD-10-CM | POA: Diagnosis not present

## 2016-02-27 DIAGNOSIS — I517 Cardiomegaly: Secondary | ICD-10-CM | POA: Diagnosis not present

## 2016-02-27 DIAGNOSIS — F419 Anxiety disorder, unspecified: Secondary | ICD-10-CM | POA: Diagnosis not present

## 2016-03-08 DIAGNOSIS — H6522 Chronic serous otitis media, left ear: Secondary | ICD-10-CM | POA: Diagnosis not present

## 2016-03-08 DIAGNOSIS — J322 Chronic ethmoidal sinusitis: Secondary | ICD-10-CM | POA: Diagnosis not present

## 2016-03-08 DIAGNOSIS — J370 Chronic laryngitis: Secondary | ICD-10-CM | POA: Diagnosis not present

## 2016-03-08 DIAGNOSIS — J320 Chronic maxillary sinusitis: Secondary | ICD-10-CM | POA: Diagnosis not present

## 2016-05-02 DIAGNOSIS — F411 Generalized anxiety disorder: Secondary | ICD-10-CM | POA: Diagnosis not present

## 2016-05-02 DIAGNOSIS — G4700 Insomnia, unspecified: Secondary | ICD-10-CM | POA: Diagnosis not present

## 2016-08-15 ENCOUNTER — Other Ambulatory Visit: Payer: Self-pay | Admitting: Obstetrics

## 2016-08-15 DIAGNOSIS — Z1231 Encounter for screening mammogram for malignant neoplasm of breast: Secondary | ICD-10-CM

## 2016-08-31 ENCOUNTER — Ambulatory Visit
Admission: RE | Admit: 2016-08-31 | Discharge: 2016-08-31 | Disposition: A | Discharge status: Home / Self Care | Payer: Medicare Other | Source: Ambulatory Visit | Attending: Obstetrics | Admitting: Obstetrics

## 2016-08-31 DIAGNOSIS — Z1231 Encounter for screening mammogram for malignant neoplasm of breast: Secondary | ICD-10-CM

## 2016-09-06 DIAGNOSIS — G4700 Insomnia, unspecified: Secondary | ICD-10-CM | POA: Diagnosis not present

## 2016-09-06 DIAGNOSIS — F411 Generalized anxiety disorder: Secondary | ICD-10-CM | POA: Diagnosis not present

## 2016-09-20 DIAGNOSIS — J069 Acute upper respiratory infection, unspecified: Secondary | ICD-10-CM | POA: Diagnosis not present

## 2016-09-27 DIAGNOSIS — R05 Cough: Secondary | ICD-10-CM | POA: Diagnosis not present

## 2016-10-10 DIAGNOSIS — Z0000 Encounter for general adult medical examination without abnormal findings: Secondary | ICD-10-CM | POA: Diagnosis not present

## 2016-10-10 DIAGNOSIS — E559 Vitamin D deficiency, unspecified: Secondary | ICD-10-CM | POA: Diagnosis not present

## 2016-10-10 DIAGNOSIS — R7301 Impaired fasting glucose: Secondary | ICD-10-CM | POA: Diagnosis not present

## 2016-10-10 DIAGNOSIS — E781 Pure hyperglyceridemia: Secondary | ICD-10-CM | POA: Diagnosis not present

## 2016-10-10 DIAGNOSIS — F419 Anxiety disorder, unspecified: Secondary | ICD-10-CM | POA: Diagnosis not present

## 2016-10-10 DIAGNOSIS — J069 Acute upper respiratory infection, unspecified: Secondary | ICD-10-CM | POA: Diagnosis not present

## 2016-10-10 DIAGNOSIS — R319 Hematuria, unspecified: Secondary | ICD-10-CM | POA: Diagnosis not present

## 2016-10-10 DIAGNOSIS — Z79899 Other long term (current) drug therapy: Secondary | ICD-10-CM | POA: Diagnosis not present

## 2016-10-10 DIAGNOSIS — R8290 Unspecified abnormal findings in urine: Secondary | ICD-10-CM | POA: Diagnosis not present

## 2016-10-10 DIAGNOSIS — E663 Overweight: Secondary | ICD-10-CM | POA: Diagnosis not present

## 2016-10-10 DIAGNOSIS — Z86010 Personal history of colonic polyps: Secondary | ICD-10-CM | POA: Diagnosis not present

## 2016-10-11 ENCOUNTER — Other Ambulatory Visit: Payer: Self-pay | Admitting: Family Medicine

## 2016-10-11 DIAGNOSIS — R5381 Other malaise: Secondary | ICD-10-CM

## 2016-10-17 ENCOUNTER — Other Ambulatory Visit: Payer: Self-pay | Admitting: Family Medicine

## 2016-10-17 DIAGNOSIS — E2839 Other primary ovarian failure: Secondary | ICD-10-CM

## 2016-10-29 DIAGNOSIS — Z23 Encounter for immunization: Secondary | ICD-10-CM | POA: Diagnosis not present

## 2017-01-31 DIAGNOSIS — E663 Overweight: Secondary | ICD-10-CM | POA: Diagnosis not present

## 2017-01-31 DIAGNOSIS — R05 Cough: Secondary | ICD-10-CM | POA: Diagnosis not present

## 2017-02-03 DIAGNOSIS — J069 Acute upper respiratory infection, unspecified: Secondary | ICD-10-CM | POA: Diagnosis not present

## 2017-03-26 DIAGNOSIS — F411 Generalized anxiety disorder: Secondary | ICD-10-CM | POA: Diagnosis not present

## 2017-03-26 DIAGNOSIS — G4700 Insomnia, unspecified: Secondary | ICD-10-CM | POA: Diagnosis not present

## 2017-04-08 DIAGNOSIS — R05 Cough: Secondary | ICD-10-CM | POA: Diagnosis not present

## 2017-04-08 DIAGNOSIS — R509 Fever, unspecified: Secondary | ICD-10-CM | POA: Diagnosis not present

## 2017-05-03 DIAGNOSIS — M25562 Pain in left knee: Secondary | ICD-10-CM | POA: Diagnosis not present

## 2017-05-16 DIAGNOSIS — M1711 Unilateral primary osteoarthritis, right knee: Secondary | ICD-10-CM | POA: Diagnosis not present

## 2017-05-31 DIAGNOSIS — D2272 Melanocytic nevi of left lower limb, including hip: Secondary | ICD-10-CM | POA: Diagnosis not present

## 2017-05-31 DIAGNOSIS — L570 Actinic keratosis: Secondary | ICD-10-CM | POA: Diagnosis not present

## 2017-05-31 DIAGNOSIS — L820 Inflamed seborrheic keratosis: Secondary | ICD-10-CM | POA: Diagnosis not present

## 2017-05-31 DIAGNOSIS — D485 Neoplasm of uncertain behavior of skin: Secondary | ICD-10-CM | POA: Diagnosis not present

## 2017-05-31 DIAGNOSIS — D1801 Hemangioma of skin and subcutaneous tissue: Secondary | ICD-10-CM | POA: Diagnosis not present

## 2017-05-31 DIAGNOSIS — L821 Other seborrheic keratosis: Secondary | ICD-10-CM | POA: Diagnosis not present

## 2017-08-13 DIAGNOSIS — G4700 Insomnia, unspecified: Secondary | ICD-10-CM | POA: Diagnosis not present

## 2017-08-13 DIAGNOSIS — F411 Generalized anxiety disorder: Secondary | ICD-10-CM | POA: Diagnosis not present

## 2017-09-30 ENCOUNTER — Other Ambulatory Visit: Payer: Self-pay | Admitting: Family Medicine

## 2017-09-30 DIAGNOSIS — Z1231 Encounter for screening mammogram for malignant neoplasm of breast: Secondary | ICD-10-CM

## 2017-10-02 ENCOUNTER — Ambulatory Visit
Admission: RE | Admit: 2017-10-02 | Discharge: 2017-10-02 | Disposition: A | Discharge status: Home / Self Care | Payer: Medicare Other | Source: Ambulatory Visit | Attending: Family Medicine | Admitting: Family Medicine

## 2017-10-02 ENCOUNTER — Other Ambulatory Visit: Payer: Self-pay | Admitting: Family Medicine

## 2017-10-02 DIAGNOSIS — E2839 Other primary ovarian failure: Secondary | ICD-10-CM

## 2017-10-02 DIAGNOSIS — Z1231 Encounter for screening mammogram for malignant neoplasm of breast: Secondary | ICD-10-CM

## 2017-10-17 DIAGNOSIS — F419 Anxiety disorder, unspecified: Secondary | ICD-10-CM | POA: Diagnosis not present

## 2017-10-17 DIAGNOSIS — Z1159 Encounter for screening for other viral diseases: Secondary | ICD-10-CM | POA: Diagnosis not present

## 2017-10-17 DIAGNOSIS — R899 Unspecified abnormal finding in specimens from other organs, systems and tissues: Secondary | ICD-10-CM | POA: Diagnosis not present

## 2017-10-17 DIAGNOSIS — Z86010 Personal history of colonic polyps: Secondary | ICD-10-CM | POA: Diagnosis not present

## 2017-10-17 DIAGNOSIS — R8290 Unspecified abnormal findings in urine: Secondary | ICD-10-CM | POA: Diagnosis not present

## 2017-10-17 DIAGNOSIS — Z79899 Other long term (current) drug therapy: Secondary | ICD-10-CM | POA: Diagnosis not present

## 2017-10-17 DIAGNOSIS — R7301 Impaired fasting glucose: Secondary | ICD-10-CM | POA: Diagnosis not present

## 2017-10-17 DIAGNOSIS — E781 Pure hyperglyceridemia: Secondary | ICD-10-CM | POA: Diagnosis not present

## 2017-10-17 DIAGNOSIS — Z23 Encounter for immunization: Secondary | ICD-10-CM | POA: Diagnosis not present

## 2017-10-17 DIAGNOSIS — Z0000 Encounter for general adult medical examination without abnormal findings: Secondary | ICD-10-CM | POA: Diagnosis not present

## 2017-10-30 DIAGNOSIS — F419 Anxiety disorder, unspecified: Secondary | ICD-10-CM | POA: Diagnosis not present

## 2017-10-30 DIAGNOSIS — R899 Unspecified abnormal finding in specimens from other organs, systems and tissues: Secondary | ICD-10-CM | POA: Diagnosis not present

## 2017-10-30 DIAGNOSIS — Z86010 Personal history of colonic polyps: Secondary | ICD-10-CM | POA: Diagnosis not present

## 2017-10-30 DIAGNOSIS — R8290 Unspecified abnormal findings in urine: Secondary | ICD-10-CM | POA: Diagnosis not present

## 2017-10-30 DIAGNOSIS — E781 Pure hyperglyceridemia: Secondary | ICD-10-CM | POA: Diagnosis not present

## 2017-10-30 DIAGNOSIS — R7301 Impaired fasting glucose: Secondary | ICD-10-CM | POA: Diagnosis not present

## 2017-10-30 DIAGNOSIS — Z1159 Encounter for screening for other viral diseases: Secondary | ICD-10-CM | POA: Diagnosis not present

## 2017-10-30 DIAGNOSIS — Z79899 Other long term (current) drug therapy: Secondary | ICD-10-CM | POA: Diagnosis not present

## 2017-11-21 DIAGNOSIS — R899 Unspecified abnormal finding in specimens from other organs, systems and tissues: Secondary | ICD-10-CM | POA: Diagnosis not present

## 2017-11-25 ENCOUNTER — Ambulatory Visit
Admission: RE | Admit: 2017-11-25 | Discharge: 2017-11-25 | Disposition: A | Discharge status: Home / Self Care | Payer: Medicare Other | Source: Ambulatory Visit | Attending: Family Medicine | Admitting: Family Medicine

## 2017-11-25 DIAGNOSIS — Z780 Asymptomatic menopausal state: Secondary | ICD-10-CM | POA: Diagnosis not present

## 2017-11-25 DIAGNOSIS — M85852 Other specified disorders of bone density and structure, left thigh: Secondary | ICD-10-CM | POA: Diagnosis not present

## 2017-11-25 DIAGNOSIS — E2839 Other primary ovarian failure: Secondary | ICD-10-CM

## 2017-12-02 DIAGNOSIS — H5211 Myopia, right eye: Secondary | ICD-10-CM | POA: Diagnosis not present

## 2017-12-02 DIAGNOSIS — Z961 Presence of intraocular lens: Secondary | ICD-10-CM | POA: Diagnosis not present

## 2017-12-02 DIAGNOSIS — H5202 Hypermetropia, left eye: Secondary | ICD-10-CM | POA: Diagnosis not present

## 2017-12-02 DIAGNOSIS — H2512 Age-related nuclear cataract, left eye: Secondary | ICD-10-CM | POA: Diagnosis not present

## 2018-01-08 DIAGNOSIS — G4700 Insomnia, unspecified: Secondary | ICD-10-CM | POA: Diagnosis not present

## 2018-01-08 DIAGNOSIS — F411 Generalized anxiety disorder: Secondary | ICD-10-CM | POA: Diagnosis not present

## 2018-06-11 DIAGNOSIS — G4700 Insomnia, unspecified: Secondary | ICD-10-CM | POA: Diagnosis not present

## 2018-06-11 DIAGNOSIS — F411 Generalized anxiety disorder: Secondary | ICD-10-CM | POA: Diagnosis not present

## 2018-10-21 DIAGNOSIS — Z23 Encounter for immunization: Secondary | ICD-10-CM | POA: Diagnosis not present

## 2018-11-12 DIAGNOSIS — Z86010 Personal history of colonic polyps: Secondary | ICD-10-CM | POA: Diagnosis not present

## 2018-11-12 DIAGNOSIS — R7301 Impaired fasting glucose: Secondary | ICD-10-CM | POA: Diagnosis not present

## 2018-11-12 DIAGNOSIS — Z0000 Encounter for general adult medical examination without abnormal findings: Secondary | ICD-10-CM | POA: Diagnosis not present

## 2018-11-12 DIAGNOSIS — F419 Anxiety disorder, unspecified: Secondary | ICD-10-CM | POA: Diagnosis not present

## 2018-11-12 DIAGNOSIS — E781 Pure hyperglyceridemia: Secondary | ICD-10-CM | POA: Diagnosis not present

## 2018-11-12 DIAGNOSIS — R8290 Unspecified abnormal findings in urine: Secondary | ICD-10-CM | POA: Diagnosis not present

## 2018-11-12 DIAGNOSIS — Z79899 Other long term (current) drug therapy: Secondary | ICD-10-CM | POA: Diagnosis not present

## 2018-11-12 DIAGNOSIS — R899 Unspecified abnormal finding in specimens from other organs, systems and tissues: Secondary | ICD-10-CM | POA: Diagnosis not present

## 2018-11-24 ENCOUNTER — Other Ambulatory Visit: Payer: Self-pay | Admitting: Family Medicine

## 2018-11-24 DIAGNOSIS — Z1231 Encounter for screening mammogram for malignant neoplasm of breast: Secondary | ICD-10-CM

## 2018-12-16 DIAGNOSIS — T148XXA Other injury of unspecified body region, initial encounter: Secondary | ICD-10-CM | POA: Diagnosis not present

## 2018-12-24 DIAGNOSIS — F411 Generalized anxiety disorder: Secondary | ICD-10-CM | POA: Diagnosis not present

## 2018-12-24 DIAGNOSIS — G4700 Insomnia, unspecified: Secondary | ICD-10-CM | POA: Diagnosis not present

## 2019-01-09 ENCOUNTER — Ambulatory Visit: Payer: Medicare Other

## 2019-02-20 ENCOUNTER — Ambulatory Visit
Admission: RE | Admit: 2019-02-20 | Discharge: 2019-02-20 | Disposition: A | Discharge status: Home / Self Care | Payer: Medicare Other | Source: Ambulatory Visit | Attending: Family Medicine | Admitting: Family Medicine

## 2019-02-20 ENCOUNTER — Other Ambulatory Visit: Payer: Self-pay

## 2019-02-20 ENCOUNTER — Other Ambulatory Visit: Payer: Self-pay | Admitting: Family Medicine

## 2019-02-20 DIAGNOSIS — R1031 Right lower quadrant pain: Secondary | ICD-10-CM

## 2019-02-20 DIAGNOSIS — K5731 Diverticulosis of large intestine without perforation or abscess with bleeding: Secondary | ICD-10-CM | POA: Diagnosis not present

## 2019-02-24 ENCOUNTER — Ambulatory Visit
Admission: RE | Admit: 2019-02-24 | Discharge: 2019-02-24 | Disposition: A | Discharge status: Home / Self Care | Payer: Medicare Other | Source: Ambulatory Visit | Attending: Family Medicine | Admitting: Family Medicine

## 2019-02-24 ENCOUNTER — Other Ambulatory Visit: Payer: Self-pay

## 2019-02-24 DIAGNOSIS — Z1231 Encounter for screening mammogram for malignant neoplasm of breast: Secondary | ICD-10-CM | POA: Diagnosis not present

## 2019-07-30 DIAGNOSIS — G4700 Insomnia, unspecified: Secondary | ICD-10-CM | POA: Diagnosis not present

## 2019-07-30 DIAGNOSIS — F411 Generalized anxiety disorder: Secondary | ICD-10-CM | POA: Diagnosis not present

## 2019-11-13 DIAGNOSIS — R8290 Unspecified abnormal findings in urine: Secondary | ICD-10-CM | POA: Diagnosis not present

## 2019-11-13 DIAGNOSIS — Z23 Encounter for immunization: Secondary | ICD-10-CM | POA: Diagnosis not present

## 2019-11-13 DIAGNOSIS — Z0000 Encounter for general adult medical examination without abnormal findings: Secondary | ICD-10-CM | POA: Diagnosis not present

## 2019-11-13 DIAGNOSIS — E781 Pure hyperglyceridemia: Secondary | ICD-10-CM | POA: Diagnosis not present

## 2019-11-13 DIAGNOSIS — Z86010 Personal history of colonic polyps: Secondary | ICD-10-CM | POA: Diagnosis not present

## 2019-11-13 DIAGNOSIS — R7303 Prediabetes: Secondary | ICD-10-CM | POA: Diagnosis not present

## 2019-11-13 DIAGNOSIS — Z79899 Other long term (current) drug therapy: Secondary | ICD-10-CM | POA: Diagnosis not present

## 2019-11-13 DIAGNOSIS — F419 Anxiety disorder, unspecified: Secondary | ICD-10-CM | POA: Diagnosis not present

## 2019-12-08 DIAGNOSIS — H2512 Age-related nuclear cataract, left eye: Secondary | ICD-10-CM | POA: Diagnosis not present

## 2019-12-08 DIAGNOSIS — H5201 Hypermetropia, right eye: Secondary | ICD-10-CM | POA: Diagnosis not present

## 2019-12-08 DIAGNOSIS — H5212 Myopia, left eye: Secondary | ICD-10-CM | POA: Diagnosis not present

## 2019-12-16 DIAGNOSIS — Z23 Encounter for immunization: Secondary | ICD-10-CM | POA: Diagnosis not present

## 2019-12-16 DIAGNOSIS — Z86010 Personal history of colonic polyps: Secondary | ICD-10-CM | POA: Diagnosis not present

## 2019-12-16 DIAGNOSIS — R8290 Unspecified abnormal findings in urine: Secondary | ICD-10-CM | POA: Diagnosis not present

## 2019-12-16 DIAGNOSIS — R7303 Prediabetes: Secondary | ICD-10-CM | POA: Diagnosis not present

## 2019-12-16 DIAGNOSIS — E781 Pure hyperglyceridemia: Secondary | ICD-10-CM | POA: Diagnosis not present

## 2019-12-16 DIAGNOSIS — F419 Anxiety disorder, unspecified: Secondary | ICD-10-CM | POA: Diagnosis not present

## 2019-12-16 DIAGNOSIS — Z79899 Other long term (current) drug therapy: Secondary | ICD-10-CM | POA: Diagnosis not present

## 2019-12-16 DIAGNOSIS — Z0000 Encounter for general adult medical examination without abnormal findings: Secondary | ICD-10-CM | POA: Diagnosis not present

## 2020-02-10 DIAGNOSIS — S0502XA Injury of conjunctiva and corneal abrasion without foreign body, left eye, initial encounter: Secondary | ICD-10-CM | POA: Diagnosis not present

## 2020-02-12 DIAGNOSIS — Z20828 Contact with and (suspected) exposure to other viral communicable diseases: Secondary | ICD-10-CM | POA: Diagnosis not present

## 2020-02-13 DIAGNOSIS — Z20828 Contact with and (suspected) exposure to other viral communicable diseases: Secondary | ICD-10-CM | POA: Diagnosis not present

## 2020-02-17 DIAGNOSIS — S0502XD Injury of conjunctiva and corneal abrasion without foreign body, left eye, subsequent encounter: Secondary | ICD-10-CM | POA: Diagnosis not present

## 2020-05-04 DIAGNOSIS — G4700 Insomnia, unspecified: Secondary | ICD-10-CM | POA: Diagnosis not present

## 2020-05-04 DIAGNOSIS — F411 Generalized anxiety disorder: Secondary | ICD-10-CM | POA: Diagnosis not present

## 2020-05-06 DIAGNOSIS — R0989 Other specified symptoms and signs involving the circulatory and respiratory systems: Secondary | ICD-10-CM | POA: Diagnosis not present

## 2020-05-06 DIAGNOSIS — R062 Wheezing: Secondary | ICD-10-CM | POA: Diagnosis not present

## 2020-05-09 DIAGNOSIS — R059 Cough, unspecified: Secondary | ICD-10-CM | POA: Diagnosis not present

## 2020-05-09 DIAGNOSIS — J209 Acute bronchitis, unspecified: Secondary | ICD-10-CM | POA: Diagnosis not present

## 2020-05-13 ENCOUNTER — Telehealth: Payer: Self-pay | Admitting: *Deleted

## 2020-05-13 NOTE — Telephone Encounter (Signed)
Received a fax from [facility], it is a copy of chest x-ray done on 05/06/20. The patient has not been seen since 2017. Question is this a referral, does the patient need to be seen by cardiology?

## 2020-05-16 NOTE — Telephone Encounter (Signed)
Jan from [facility] returning call. She states the patient had requested the x-ray be sent to the office. She states she did not realize it had been so long since the patient had been seen. She will contact the patient to see if she wants to be referred.

## 2020-06-06 DIAGNOSIS — U071 COVID-19: Secondary | ICD-10-CM | POA: Diagnosis not present

## 2020-08-01 ENCOUNTER — Other Ambulatory Visit: Payer: Self-pay | Admitting: Family Medicine

## 2020-08-01 DIAGNOSIS — Z1231 Encounter for screening mammogram for malignant neoplasm of breast: Secondary | ICD-10-CM

## 2020-08-04 ENCOUNTER — Encounter: Payer: Self-pay | Admitting: Internal Medicine

## 2020-09-23 ENCOUNTER — Ambulatory Visit
Admission: RE | Admit: 2020-09-23 | Discharge: 2020-09-23 | Disposition: A | Discharge status: Home / Self Care | Payer: Medicare Other | Source: Ambulatory Visit | Attending: Family Medicine | Admitting: Family Medicine

## 2020-09-23 ENCOUNTER — Other Ambulatory Visit: Payer: Self-pay

## 2020-09-23 DIAGNOSIS — Z1231 Encounter for screening mammogram for malignant neoplasm of breast: Secondary | ICD-10-CM

## 2020-09-26 NOTE — Progress Notes (Signed)
Referring-Francis Jacelyn Grip MD Reason for referral-atherosclerosis  HPI: 75 yo female for evaluation of atherosclerosis at request of Yaakov Guthrie MD. Patient seen previously but not since 2017.  At that time she was seen for cardiomegaly and palpitations.  Echocardiogram 2018 showed normal LV function, trace aortic insufficiency and mild mitral regurgitation.  Chest x-ray April 2022 showed mild cardiomegaly and aortic atherosclerosis.  Patient has dyspnea with more vigorous activities but not routine activities.  No orthopnea, PND, pedal edema, claudication, syncope.  Rare palpitations.  She does have occasional chest pain in the substernal area without radiation or associated symptoms.  Not pleuritic or positional.  Not clearly exertional.  Typically last 5 minutes and resolve spontaneously.  Cardiology now asked to evaluate.  Current Outpatient Medications  Medication Sig Dispense Refill   ALPRAZolam (XANAX) 0.5 MG tablet Take 0.5 mg by mouth at bedtime as needed for sleep.     B Complex Vitamins (VITAMIN-B COMPLEX PO) Take 1 tablet by mouth daily. Reported on 07/27/2015     ergocalciferol (VITAMIN D2) 50000 UNITS capsule Take 50,000 Units by mouth once a week.     OVER THE COUNTER MEDICATION Take 1 capsule by mouth daily. Move free joint supplement     Probiotic Product (PROBIOTIC DAILY PO) Take 2 tablets by mouth daily.     vitamin C (ASCORBIC ACID) 500 MG tablet Take 500 mg by mouth daily. [phone] mg a day     No current facility-administered medications for this visit.    Allergies  Allergen Reactions   Sulfonamide Derivatives Other (See Comments)    Pt states she turns bright red     Past Medical History:  Diagnosis Date   Anemia    Anxiety    pt denies at this time 07-27-15   Arthritis    ddd   Cardiac arrhythmia    palpitation   Colon polyps    adenomatous   Diverticulitis    Diverticulosis    Hypertriglyceridemia    Pneumonia    Vitamin D deficiency     Past  Surgical History:  Procedure Laterality Date   COLECTOMY  2008   due to kink from popcorn kernal   COLONOSCOPY     MENISCUS REPAIR Left    TONSILLECTOMY AND ADENOIDECTOMY      Social History   Socioeconomic History   Marital status: Married    Spouse name: Not on file   Number of children: 0   Years of education: Not on file   Highest education level: Not on file  Occupational History   Occupation: real estate  Tobacco Use   Smoking status: Never   Smokeless tobacco: Never  Substance and Sexual Activity   Alcohol use: Yes    Alcohol/week: 0.0 standard drinks    Comment: only on social ocassion   Drug use: No   Sexual activity: Yes  Other Topics Concern   Not on file  Social History Narrative   Not on file   Social Determinants of Health   Financial Resource Strain: Not on file  Food Insecurity: Not on file  Transportation Needs: Not on file  Physical Activity: Not on file  Stress: Not on file  Social Connections: Not on file  Intimate Partner Violence: Not on file    Family History  Problem Relation Age of Onset   Hypertension Mother    Kidney disease Mother    Heart disease Mother    Prostate cancer Father    Arthritis Sister  Colon cancer Neg Hx    Colon polyps Neg Hx    Esophageal cancer Neg Hx    Rectal cancer Neg Hx    Stomach cancer Neg Hx     ROS: no fevers or chills, productive cough, hemoptysis, dysphasia, odynophagia, melena, hematochezia, dysuria, hematuria, rash, seizure activity, orthopnea, PND, pedal edema, claudication. Remaining systems are negative.  Physical Exam:   Blood pressure (!) 150/76, pulse 78, height '5\' 4"'$  (1.626 m), weight 160 lb (72.6 kg), SpO2 99 %.  General:  Well developed/well nourished in NAD Skin warm/dry Patient not depressed No peripheral clubbing Back-normal HEENT-normal/normal eyelids Neck supple/normal carotid upstroke bilaterally; no bruits; no JVD; no thyromegaly chest - CTA/ normal expansion CV -  RRR/normal S1 and S2; no murmurs, rubs or gallops;  PMI nondisplaced Abdomen -NT/ND, no HSM, no mass, + bowel sounds, no bruit 2+ femoral pulses, no bruits Ext-no edema, chords, 2+ DP Neuro-grossly nonfocal  ECG -normal sinus rhythm with PACs, normal axis, RV conduction delay, nonspecific ST changes.  Personally reviewed  A/P  1 aortic atherosclerosis-noted on x-ray.  We will add Crestor 20 mg daily.  2 chest pain-symptoms are atypical.  She is very active and typically does not have exertional chest pain.  I will arrange a stress nuclear study for risk stratification.  I will not pursue cardiac CTA as she has multiple PACs on her electrocardiogram and study would be difficult to gait.  3 hyperlipidemia-as outlined above given documented aortic atherosclerosis we will treat with a statin.  Begin Crestor 20 mg daily and check lipids and liver in 12 weeks.  4 elevated blood pressure-she will follow her blood pressure at home.  Goal systolic blood pressure [id number] or less and diastolic 85 or less.  If she is not at goal we will add medical therapy.  Kirk Ruths, MD

## 2020-10-04 ENCOUNTER — Ambulatory Visit (INDEPENDENT_AMBULATORY_CARE_PROVIDER_SITE_OTHER): Payer: Medicare Other | Admitting: Cardiology

## 2020-10-04 ENCOUNTER — Other Ambulatory Visit: Payer: Self-pay

## 2020-10-04 ENCOUNTER — Encounter: Payer: Self-pay | Admitting: Cardiology

## 2020-10-04 VITALS — BP 150/76 | HR 78 | Ht 64.0 in | Wt 160.0 lb

## 2020-10-04 DIAGNOSIS — I700 Atherosclerosis of aorta: Secondary | ICD-10-CM

## 2020-10-04 DIAGNOSIS — E7800 Pure hypercholesterolemia, unspecified: Secondary | ICD-10-CM | POA: Diagnosis not present

## 2020-10-04 DIAGNOSIS — R072 Precordial pain: Secondary | ICD-10-CM | POA: Diagnosis not present

## 2020-10-04 MED ORDER — ROSUVASTATIN CALCIUM 20 MG PO TABS: 20.0000 mg | ORAL_TABLET | Freq: Every day | ORAL | 3 refills | Status: DC

## 2020-10-04 NOTE — Patient Instructions (Signed)
Medication Instructions:   START ROSUVASTATIN 20 MG ONCE DAILY  *If you need a refill on your cardiac medications before your next appointment, please call your pharmacy*   Lab Work:  Your physician recommends that you return for lab work in: [place]  If you have labs (blood work) drawn today and your tests are completely normal, you will receive your results only by: [place] (if you have MyChart) OR A paper copy in the mail If you have any lab test that is abnormal or we need to change your treatment, we will call you to review the results.   Testing/Procedures:  Your physician has requested that you have en exercise stress myoview. For further information please visit [email or web address]. Please follow instruction sheet, as given. [place]   Follow-Up: At [facility], you and your health needs are our priority.  As part of our continuing mission to provide you with exceptional heart care, we have created designated Provider Care Teams.  These Care Teams include your primary Cardiologist (physician) and Advanced Practice Providers (APPs -  Physician Assistants and Nurse Practitioners) who all work together to provide you with the care you need, when you need it.  We recommend signing up for the patient portal called "MyChart".  Sign up information is provided on this After Visit Summary.  MyChart is used to connect with patients for Virtual Visits (Telemedicine).  Patients are able to view lab/test results, encounter notes, upcoming appointments, etc.  Non-urgent messages can be sent to your provider as well.   To learn more about what you can do with MyChart, go to [email or web address].    Your next appointment:   12 month(s)  The format for your next appointment:   In Person  Provider:   Kirk Ruths, MD

## 2020-10-05 ENCOUNTER — Telehealth (HOSPITAL_COMMUNITY): Payer: Self-pay | Admitting: *Deleted

## 2020-10-05 NOTE — Telephone Encounter (Signed)
Patient given detailed instructions per Myocardial Perfusion Study Information Sheet for the test on 10/12/20 at 0745. Patient notified to arrive 15 minutes early and that it is imperative to arrive on time for appointment to keep from having the test rescheduled.  If you need to cancel or reschedule your appointment, please call the office within 24 hours of your appointment. Called patient and she was driving so she asked if I could send instructions thru mychart. Letter sent with instructions by mychart.. Patient verbalized understanding.Moe Graca, [place]

## 2020-10-11 ENCOUNTER — Encounter (HOSPITAL_COMMUNITY): Payer: Self-pay | Admitting: *Deleted

## 2020-10-12 ENCOUNTER — Other Ambulatory Visit: Payer: Self-pay

## 2020-10-12 ENCOUNTER — Ambulatory Visit (HOSPITAL_COMMUNITY): Payer: Medicare Other | Attending: Cardiovascular Disease

## 2020-10-12 DIAGNOSIS — R072 Precordial pain: Secondary | ICD-10-CM | POA: Diagnosis not present

## 2020-10-12 LAB — MYOCARDIAL PERFUSION IMAGING
Angina Index: 0
Base ST Depression (mm): 0 mm
Duke Treadmill Score: 7
Estimated workload: 7
Exercise duration (min): 6 min
Exercise duration (sec): 30 s
LV dias vol: 70 mL (ref 46–106)
LV sys vol: 27 mL
MPHR: 145 {beats}/min
Nuc Stress EF: 62 %
Peak HR: 141 {beats}/min
Percent HR: 97 %
Rest HR: 66 {beats}/min
Rest Nuclear Isotope Dose: 10.7 mCi
SDS: 6
SRS: 2
SSS: 8
ST Depression (mm): 0 mm
Stress Nuclear Isotope Dose: 31.3 mCi
TID: 0.97

## 2020-10-12 MED ORDER — TECHNETIUM TC 99M TETROFOSMIN IV KIT
31.3000 | PACK | Freq: Once | INTRAVENOUS | Status: AC | PRN
  Administered 2020-10-12: 31.3 via INTRAVENOUS
  Filled 2020-10-12: qty 32

## 2020-10-12 MED ORDER — TECHNETIUM TC 99M TETROFOSMIN IV KIT
10.7000 | PACK | Freq: Once | INTRAVENOUS | Status: AC | PRN
  Administered 2020-10-12: 10.7 via INTRAVENOUS
  Filled 2020-10-12: qty 11

## 2020-10-13 ENCOUNTER — Other Ambulatory Visit: Payer: Self-pay

## 2020-10-19 ENCOUNTER — Other Ambulatory Visit: Payer: Self-pay

## 2020-10-19 MED ORDER — ROSUVASTATIN CALCIUM 10 MG PO TABS: 10.0000 mg | ORAL_TABLET | Freq: Every day | ORAL | 3 refills | Status: DC

## 2020-10-19 NOTE — Telephone Encounter (Signed)
Spoke to patient she stated she was concerned when she had stress test she was told she was in AFib.Stated nurse ask her when she hooked her up to stress test did she know she was in AFib.She also told her B/P was elevated.Stated she was not aware.She wanted to ask Dr.Crenshaw if she needed a appointment with him to sit down and discuss.Advised I will send message to Dr.Crenshaw.

## 2020-10-19 NOTE — Telephone Encounter (Signed)
Spoke to patient Dr.Crenshaw's advice given.She will monitor B/P daily for 1 week and send readings to Dr.Crenshaw through mychart.

## 2020-11-02 DIAGNOSIS — F411 Generalized anxiety disorder: Secondary | ICD-10-CM | POA: Diagnosis not present

## 2020-11-02 DIAGNOSIS — G4700 Insomnia, unspecified: Secondary | ICD-10-CM | POA: Diagnosis not present

## 2020-11-07 DIAGNOSIS — Z23 Encounter for immunization: Secondary | ICD-10-CM | POA: Diagnosis not present

## 2020-11-23 DIAGNOSIS — Z23 Encounter for immunization: Secondary | ICD-10-CM | POA: Diagnosis not present

## 2020-11-23 DIAGNOSIS — Z0000 Encounter for general adult medical examination without abnormal findings: Secondary | ICD-10-CM | POA: Diagnosis not present

## 2020-11-23 DIAGNOSIS — Z1211 Encounter for screening for malignant neoplasm of colon: Secondary | ICD-10-CM | POA: Diagnosis not present

## 2020-11-23 DIAGNOSIS — R7301 Impaired fasting glucose: Secondary | ICD-10-CM | POA: Diagnosis not present

## 2020-12-01 DIAGNOSIS — R7301 Impaired fasting glucose: Secondary | ICD-10-CM | POA: Diagnosis not present

## 2020-12-01 DIAGNOSIS — Z0000 Encounter for general adult medical examination without abnormal findings: Secondary | ICD-10-CM | POA: Diagnosis not present

## 2020-12-12 DIAGNOSIS — Z23 Encounter for immunization: Secondary | ICD-10-CM | POA: Diagnosis not present

## 2021-02-17 DIAGNOSIS — M5451 Vertebrogenic low back pain: Secondary | ICD-10-CM | POA: Diagnosis not present

## 2021-02-17 DIAGNOSIS — M25561 Pain in right knee: Secondary | ICD-10-CM | POA: Diagnosis not present

## 2021-03-12 DIAGNOSIS — J029 Acute pharyngitis, unspecified: Secondary | ICD-10-CM | POA: Diagnosis not present

## 2021-03-12 DIAGNOSIS — Z03818 Encounter for observation for suspected exposure to other biological agents ruled out: Secondary | ICD-10-CM | POA: Diagnosis not present

## 2021-03-12 DIAGNOSIS — B349 Viral infection, unspecified: Secondary | ICD-10-CM | POA: Diagnosis not present

## 2021-04-04 DIAGNOSIS — L03011 Cellulitis of right finger: Secondary | ICD-10-CM | POA: Diagnosis not present

## 2021-04-04 DIAGNOSIS — L603 Nail dystrophy: Secondary | ICD-10-CM | POA: Diagnosis not present

## 2021-04-12 DIAGNOSIS — F411 Generalized anxiety disorder: Secondary | ICD-10-CM | POA: Diagnosis not present

## 2021-04-12 DIAGNOSIS — G4700 Insomnia, unspecified: Secondary | ICD-10-CM | POA: Diagnosis not present

## 2021-05-02 DIAGNOSIS — R059 Cough, unspecified: Secondary | ICD-10-CM | POA: Diagnosis not present

## 2021-05-06 DIAGNOSIS — J019 Acute sinusitis, unspecified: Secondary | ICD-10-CM | POA: Diagnosis not present

## 2021-05-06 DIAGNOSIS — J209 Acute bronchitis, unspecified: Secondary | ICD-10-CM | POA: Diagnosis not present

## 2021-05-31 DIAGNOSIS — L821 Other seborrheic keratosis: Secondary | ICD-10-CM | POA: Diagnosis not present

## 2021-05-31 DIAGNOSIS — I8392 Asymptomatic varicose veins of left lower extremity: Secondary | ICD-10-CM | POA: Diagnosis not present

## 2021-05-31 DIAGNOSIS — I8391 Asymptomatic varicose veins of right lower extremity: Secondary | ICD-10-CM | POA: Diagnosis not present

## 2021-05-31 DIAGNOSIS — Z419 Encounter for procedure for purposes other than remedying health state, unspecified: Secondary | ICD-10-CM | POA: Diagnosis not present

## 2021-06-02 DIAGNOSIS — J029 Acute pharyngitis, unspecified: Secondary | ICD-10-CM | POA: Diagnosis not present

## 2021-07-13 DIAGNOSIS — D1801 Hemangioma of skin and subcutaneous tissue: Secondary | ICD-10-CM | POA: Diagnosis not present

## 2021-07-13 DIAGNOSIS — L570 Actinic keratosis: Secondary | ICD-10-CM | POA: Diagnosis not present

## 2021-07-13 DIAGNOSIS — L821 Other seborrheic keratosis: Secondary | ICD-10-CM | POA: Diagnosis not present

## 2021-07-13 DIAGNOSIS — L03011 Cellulitis of right finger: Secondary | ICD-10-CM | POA: Diagnosis not present

## 2021-09-14 DIAGNOSIS — H2512 Age-related nuclear cataract, left eye: Secondary | ICD-10-CM | POA: Diagnosis not present

## 2021-09-14 DIAGNOSIS — H524 Presbyopia: Secondary | ICD-10-CM | POA: Diagnosis not present

## 2021-09-14 DIAGNOSIS — D3131 Benign neoplasm of right choroid: Secondary | ICD-10-CM | POA: Diagnosis not present

## 2021-10-18 DIAGNOSIS — G4700 Insomnia, unspecified: Secondary | ICD-10-CM | POA: Diagnosis not present

## 2021-10-18 DIAGNOSIS — F411 Generalized anxiety disorder: Secondary | ICD-10-CM | POA: Diagnosis not present

## 2021-11-08 DIAGNOSIS — E559 Vitamin D deficiency, unspecified: Secondary | ICD-10-CM | POA: Diagnosis not present

## 2021-11-08 DIAGNOSIS — R7303 Prediabetes: Secondary | ICD-10-CM | POA: Diagnosis not present

## 2021-11-08 DIAGNOSIS — E781 Pure hyperglyceridemia: Secondary | ICD-10-CM | POA: Diagnosis not present

## 2021-11-13 DIAGNOSIS — Z23 Encounter for immunization: Secondary | ICD-10-CM | POA: Diagnosis not present

## 2021-11-13 DIAGNOSIS — E559 Vitamin D deficiency, unspecified: Secondary | ICD-10-CM | POA: Diagnosis not present

## 2021-11-13 DIAGNOSIS — Z6829 Body mass index (BMI) 29.0-29.9, adult: Secondary | ICD-10-CM | POA: Diagnosis not present

## 2021-11-13 DIAGNOSIS — R700 Elevated erythrocyte sedimentation rate: Secondary | ICD-10-CM | POA: Diagnosis not present

## 2021-11-13 DIAGNOSIS — R030 Elevated blood-pressure reading, without diagnosis of hypertension: Secondary | ICD-10-CM | POA: Diagnosis not present

## 2021-11-13 DIAGNOSIS — Z0000 Encounter for general adult medical examination without abnormal findings: Secondary | ICD-10-CM | POA: Diagnosis not present

## 2021-11-13 DIAGNOSIS — R7303 Prediabetes: Secondary | ICD-10-CM | POA: Diagnosis not present

## 2021-11-13 DIAGNOSIS — E7800 Pure hypercholesterolemia, unspecified: Secondary | ICD-10-CM | POA: Diagnosis not present

## 2021-11-15 ENCOUNTER — Other Ambulatory Visit: Payer: Self-pay | Admitting: Family Medicine

## 2021-11-15 DIAGNOSIS — E559 Vitamin D deficiency, unspecified: Secondary | ICD-10-CM

## 2021-11-15 DIAGNOSIS — E2839 Other primary ovarian failure: Secondary | ICD-10-CM

## 2021-11-24 ENCOUNTER — Other Ambulatory Visit: Payer: Self-pay | Admitting: Family Medicine

## 2021-11-24 DIAGNOSIS — Z1231 Encounter for screening mammogram for malignant neoplasm of breast: Secondary | ICD-10-CM

## 2021-11-28 ENCOUNTER — Other Ambulatory Visit (HOSPITAL_COMMUNITY): Payer: Self-pay | Admitting: Family Medicine

## 2021-11-28 DIAGNOSIS — E781 Pure hyperglyceridemia: Secondary | ICD-10-CM

## 2021-12-13 DIAGNOSIS — M542 Cervicalgia: Secondary | ICD-10-CM | POA: Diagnosis not present

## 2021-12-20 ENCOUNTER — Ambulatory Visit (HOSPITAL_COMMUNITY)
Admission: RE | Admit: 2021-12-20 | Discharge: 2021-12-20 | Disposition: A | Discharge status: Home / Self Care | Payer: Medicare Other | Source: Ambulatory Visit | Attending: Family Medicine | Admitting: Family Medicine

## 2021-12-20 DIAGNOSIS — E781 Pure hyperglyceridemia: Secondary | ICD-10-CM | POA: Insufficient documentation

## 2021-12-25 DIAGNOSIS — M47812 Spondylosis without myelopathy or radiculopathy, cervical region: Secondary | ICD-10-CM | POA: Diagnosis not present

## 2021-12-25 DIAGNOSIS — M542 Cervicalgia: Secondary | ICD-10-CM | POA: Diagnosis not present

## 2022-01-17 ENCOUNTER — Ambulatory Visit
Admission: RE | Admit: 2022-01-17 | Discharge: 2022-01-17 | Disposition: A | Discharge status: Home / Self Care | Payer: Medicare Other | Source: Ambulatory Visit | Attending: Family Medicine | Admitting: Family Medicine

## 2022-01-17 DIAGNOSIS — Z1231 Encounter for screening mammogram for malignant neoplasm of breast: Secondary | ICD-10-CM | POA: Diagnosis not present

## 2022-01-24 DIAGNOSIS — U071 COVID-19: Secondary | ICD-10-CM | POA: Diagnosis not present

## 2022-03-06 DIAGNOSIS — R52 Pain, unspecified: Secondary | ICD-10-CM | POA: Diagnosis not present

## 2022-03-06 DIAGNOSIS — Z6828 Body mass index (BMI) 28.0-28.9, adult: Secondary | ICD-10-CM | POA: Diagnosis not present

## 2022-03-06 DIAGNOSIS — R509 Fever, unspecified: Secondary | ICD-10-CM | POA: Diagnosis not present

## 2022-03-15 DIAGNOSIS — E7800 Pure hypercholesterolemia, unspecified: Secondary | ICD-10-CM | POA: Diagnosis not present

## 2022-03-15 DIAGNOSIS — R700 Elevated erythrocyte sedimentation rate: Secondary | ICD-10-CM | POA: Diagnosis not present

## 2022-03-15 DIAGNOSIS — R7303 Prediabetes: Secondary | ICD-10-CM | POA: Diagnosis not present

## 2022-05-09 ENCOUNTER — Ambulatory Visit
Admission: RE | Admit: 2022-05-09 | Discharge: 2022-05-09 | Disposition: A | Discharge status: Home / Self Care | Payer: Medicare Other | Source: Ambulatory Visit | Attending: Family Medicine | Admitting: Family Medicine

## 2022-05-09 DIAGNOSIS — E559 Vitamin D deficiency, unspecified: Secondary | ICD-10-CM

## 2022-05-09 DIAGNOSIS — E2839 Other primary ovarian failure: Secondary | ICD-10-CM

## 2022-05-09 DIAGNOSIS — Z780 Asymptomatic menopausal state: Secondary | ICD-10-CM | POA: Diagnosis not present

## 2022-05-31 NOTE — Progress Notes (Deleted)
HPI: FU atherosclerosis. Echocardiogram 2018 showed normal LV function, trace aortic insufficiency and mild mitral regurgitation.  Chest x-ray April 2022 showed mild cardiomegaly and aortic atherosclerosis.  Nuclear study September 2022 showed ejection fraction 62% and no ischemia or infarction.  Calcium score November 2023 0.  Since last seen  Current Outpatient Medications  Medication Sig Dispense Refill   ALPRAZolam (XANAX) 0.5 MG tablet Take 0.5 mg by mouth at bedtime as needed for sleep.     B Complex Vitamins (VITAMIN-B COMPLEX PO) Take 1 tablet by mouth daily. Reported on 07/27/2015     ergocalciferol (VITAMIN D2) 50000 UNITS capsule Take 50,000 Units by mouth once a week.     OVER THE COUNTER MEDICATION Take 1 capsule by mouth daily. Move free joint supplement     Probiotic Product (PROBIOTIC DAILY PO) Take 2 tablets by mouth daily.     rosuvastatin (CRESTOR) 10 MG tablet Take 1 tablet (10 mg total) by mouth daily. 90 tablet 3   vitamin C (ASCORBIC ACID) 500 MG tablet Take 500 mg by mouth daily. [phone] mg a day     No current facility-administered medications for this visit.     Past Medical History:  Diagnosis Date   Anemia    Anxiety    pt denies at this time 07-27-15   Arthritis    ddd   Cardiac arrhythmia    palpitation   Colon polyps    adenomatous   Diverticulitis    Diverticulosis    Hypertriglyceridemia    Pneumonia    Vitamin D deficiency     Past Surgical History:  Procedure Laterality Date   COLECTOMY  2008   due to kink from popcorn kernal   COLONOSCOPY     MENISCUS REPAIR Left    TONSILLECTOMY AND ADENOIDECTOMY      Social History   Socioeconomic History   Marital status: Married    Spouse name: Not on file   Number of children: 0   Years of education: Not on file   Highest education level: Not on file  Occupational History   Occupation: real estate  Tobacco Use   Smoking status: Never   Smokeless tobacco: Never  Substance and  Sexual Activity   Alcohol use: Yes    Alcohol/week: 0.0 standard drinks of alcohol    Comment: only on social ocassion   Drug use: No   Sexual activity: Yes  Other Topics Concern   Not on file  Social History Narrative   Not on file   Social Determinants of Health   Financial Resource Strain: Not on file  Food Insecurity: Not on file  Transportation Needs: Not on file  Physical Activity: Not on file  Stress: Not on file  Social Connections: Not on file  Intimate Partner Violence: Not on file    Family History  Problem Relation Age of Onset   Hypertension Mother    Kidney disease Mother    Heart disease Mother    Prostate cancer Father    Arthritis Sister    Colon cancer Neg Hx    Colon polyps Neg Hx    Esophageal cancer Neg Hx    Rectal cancer Neg Hx    Stomach cancer Neg Hx     ROS: no fevers or chills, productive cough, hemoptysis, dysphasia, odynophagia, melena, hematochezia, dysuria, hematuria, rash, seizure activity, orthopnea, PND, pedal edema, claudication. Remaining systems are negative.  Physical Exam: Well-developed well-nourished in no acute distress.  Skin is  warm and dry.  HEENT is normal.  Neck is supple.  Chest is clear to auscultation with normal expansion.  Cardiovascular exam is regular rate and rhythm.  Abdominal exam nontender or distended. No masses palpated. Extremities show no edema. neuro grossly intact  ECG- personally reviewed  A/P  1 aortic atherosclerosis-continue statin.  Calcium score 0.  Previous nuclear study showed no ischemia.  2 hyperlipidemia-continue statin.  Olga Millers, MD

## 2022-06-04 ENCOUNTER — Telehealth: Payer: Self-pay | Admitting: Cardiology

## 2022-06-04 ENCOUNTER — Telehealth: Payer: Self-pay

## 2022-06-04 DIAGNOSIS — E7800 Pure hypercholesterolemia, unspecified: Secondary | ICD-10-CM

## 2022-06-04 NOTE — Telephone Encounter (Signed)
Received call from patient inquiring about labs for upcoming appointment in June.   Advised she is due to repeat lipids- patient has been off of Crestor due to muscle aches/pain. Not currently on therapy.   Labs ordered- advised to have these completed prior to follow up. Patient aware and verbalized understanding.

## 2022-06-04 NOTE — Telephone Encounter (Signed)
Patient would like to know if they need blood work done before her appointment on 06/28. Please advise.

## 2022-06-04 NOTE — Telephone Encounter (Signed)
Please see other encounter. This is done.

## 2022-06-07 ENCOUNTER — Ambulatory Visit: Payer: Medicare Other | Admitting: Cardiology

## 2022-06-12 DIAGNOSIS — G4700 Insomnia, unspecified: Secondary | ICD-10-CM | POA: Diagnosis not present

## 2022-06-12 DIAGNOSIS — F411 Generalized anxiety disorder: Secondary | ICD-10-CM | POA: Diagnosis not present

## 2022-07-26 NOTE — Progress Notes (Signed)
HPI: FU atherosclerosis. Patient seen previously for cardiomegaly and palpitations.  Echocardiogram 2018 showed normal LV function, trace aortic insufficiency and mild mitral regurgitation.  Chest x-ray April 2022 showed mild cardiomegaly and aortic atherosclerosis.  Nuclear study September 2022 showed ejection fraction 63% and no ischemia or infarction. Ca score 11/23 0. Since last seen, the patient denies any dyspnea on exertion, orthopnea, PND, pedal edema, palpitations, syncope or chest pain.   Current Outpatient Medications  Medication Sig Dispense Refill   ALPRAZolam (XANAX) 0.5 MG tablet Take 0.5 mg by mouth at bedtime as needed for sleep.     B Complex Vitamins (VITAMIN-B COMPLEX PO) Take 1 tablet by mouth daily. Reported on 07/27/2015     ergocalciferol (VITAMIN D2) 50000 UNITS capsule Take 50,000 Units by mouth once a week.     OVER THE COUNTER MEDICATION Take 1 capsule by mouth daily. Move free joint supplement     Probiotic Product (PROBIOTIC DAILY PO) Take 2 tablets by mouth daily.     vitamin C (ASCORBIC ACID) 500 MG tablet Take 500 mg by mouth daily. [phone] mg a day     albuterol (VENTOLIN HFA) 108 (90 Base) MCG/ACT inhaler as needed.     rosuvastatin (CRESTOR) 10 MG tablet Take 1 tablet (10 mg total) by mouth daily. 90 tablet 3   No current facility-administered medications for this visit.     Past Medical History:  Diagnosis Date   Anemia    Anxiety    pt denies at this time 07-27-15   Arthritis    ddd   Cardiac arrhythmia    palpitation   Colon polyps    adenomatous   Diverticulitis    Diverticulosis    Hypertriglyceridemia    Pneumonia    Vitamin D deficiency     Past Surgical History:  Procedure Laterality Date   COLECTOMY  2008   due to kink from popcorn kernal   COLONOSCOPY     MENISCUS REPAIR Left    TONSILLECTOMY AND ADENOIDECTOMY      Social History   Socioeconomic History   Marital status: Married    Spouse name: Not on file   Number  of children: 0   Years of education: Not on file   Highest education level: Not on file  Occupational History   Occupation: real estate  Tobacco Use   Smoking status: Never   Smokeless tobacco: Never  Substance and Sexual Activity   Alcohol use: Yes    Alcohol/week: 0.0 standard drinks of alcohol    Comment: only on social ocassion   Drug use: No   Sexual activity: Yes  Other Topics Concern   Not on file  Social History Narrative   Not on file   Social Determinants of Health   Financial Resource Strain: Not on file  Food Insecurity: Not on file  Transportation Needs: Not on file  Physical Activity: Not on file  Stress: Not on file  Social Connections: Not on file  Intimate Partner Violence: Not on file    Family History  Problem Relation Age of Onset   Hypertension Mother    Kidney disease Mother    Heart disease Mother    Prostate cancer Father    Arthritis Sister    Colon cancer Neg Hx    Colon polyps Neg Hx    Esophageal cancer Neg Hx    Rectal cancer Neg Hx    Stomach cancer Neg Hx     ROS: no  fevers or chills, productive cough, hemoptysis, dysphasia, odynophagia, melena, hematochezia, dysuria, hematuria, rash, seizure activity, orthopnea, PND, pedal edema, claudication. Remaining systems are negative.  Physical Exam: Well-developed well-nourished in no acute distress.  Skin is warm and dry.  HEENT is normal.  Neck is supple.  Chest is clear to auscultation with normal expansion.  Cardiovascular exam is regular rate and rhythm.  Abdominal exam nontender or distended. No masses palpated. Extremities show no edema. neuro grossly intact  EKG Interpretation Date/Time:  Friday August 03 2022 08:50:10 EDT Ventricular Rate:  71 PR Interval:  122 QRS Duration:  82 QT Interval:  390 QTC Calculation: 423 R Axis:   67  Text Interpretation: Sinus rhythm with Premature ventricular complexes Minimal voltage criteria for LVH, may be normal variant ( Sokolow-Lyon )  Cannot rule out septal MI Non-specific ST-t changes ST now depressed in Inferior leads Confirmed by Olga Millers ([id number]) on 08/03/2022 8:51:56 AM    A/P  1 aortic atherosclerosis-patient not tolerate statin.  2 hyperlipidemia-recent LDL 109.  DC Crestor as she is not tolerating.  Add Zetia 10 mg daily.  Check lipids and liver in 8 weeks.  3 chest pain-no recent symptoms and previous nuclear study with no ischemia and calcium score 0.  Will follow.   Olga Millers, MD

## 2022-07-31 DIAGNOSIS — E7800 Pure hypercholesterolemia, unspecified: Secondary | ICD-10-CM | POA: Diagnosis not present

## 2022-08-01 LAB — HEPATIC FUNCTION PANEL
ALT: 12 IU/L (ref 0–32)
AST: 13 IU/L (ref 0–40)
Albumin: 4 g/dL (ref 3.8–4.8)
Alkaline Phosphatase: 68 IU/L (ref 44–121)
Bilirubin Total: <0.2 mg/dL (ref 0.0–1.2)
Bilirubin, Direct: <0.1 mg/dL (ref 0.00–0.40)
Total Protein: 6.8 g/dL (ref 6.0–8.5)

## 2022-08-01 LAB — LIPID PANEL
Chol/HDL Ratio: 3.8 ratio (ref 0.0–4.4)
Cholesterol, Total: 196 mg/dL (ref 100–199)
HDL: 51 mg/dL (ref >39)
LDL Chol Calc (NIH): 109 mg/dL — ABNORMAL HIGH (ref 0–99)
Triglycerides: 210 mg/dL — ABNORMAL HIGH (ref 0–149)
VLDL Cholesterol Cal: 36 mg/dL (ref 5–40)

## 2022-08-03 ENCOUNTER — Ambulatory Visit: Payer: Medicare Other | Attending: Cardiology | Admitting: Cardiology

## 2022-08-03 ENCOUNTER — Encounter: Payer: Self-pay | Admitting: Cardiology

## 2022-08-03 VITALS — BP 120/80 | HR 71 | Ht 60.75 in | Wt 160.4 lb

## 2022-08-03 DIAGNOSIS — R072 Precordial pain: Secondary | ICD-10-CM | POA: Insufficient documentation

## 2022-08-03 DIAGNOSIS — I700 Atherosclerosis of aorta: Secondary | ICD-10-CM | POA: Diagnosis not present

## 2022-08-03 DIAGNOSIS — E7800 Pure hypercholesterolemia, unspecified: Secondary | ICD-10-CM | POA: Diagnosis not present

## 2022-08-03 MED ORDER — EZETIMIBE 10 MG PO TABS: 10.0000 mg | ORAL_TABLET | Freq: Every day | ORAL | 3 refills | Status: DC

## 2022-08-03 NOTE — Patient Instructions (Signed)
Medication Instructions:   STOP ROSUVASTATIN  START EZETIMIBE 10 MG ONCE DAILY  *If you need a refill on your cardiac medications before your next appointment, please call your pharmacy*   Lab Work:  Your physician recommends that you return for lab work in: 8 WEEKS-FASTING  If you have labs (blood work) drawn today and your tests are completely normal, you will receive your results only by: MyChart Message (if you have MyChart) OR A paper copy in the mail If you have any lab test that is abnormal or we need to change your treatment, we will call you to review the results.   Follow-Up: At [facility], you and your health needs are our priority.  As part of our continuing mission to provide you with exceptional heart care, we have created designated Provider Care Teams.  These Care Teams include your primary Cardiologist (physician) and Advanced Practice Providers (APPs -  Physician Assistants and Nurse Practitioners) who all work together to provide you with the care you need, when you need it.  We recommend signing up for the patient portal called "MyChart".  Sign up information is provided on this After Visit Summary.  MyChart is used to connect with patients for Virtual Visits (Telemedicine).  Patients are able to view lab/test results, encounter notes, upcoming appointments, etc.  Non-urgent messages can be sent to your provider as well.   To learn more about what you can do with MyChart, go to [email or web address].    Your next appointment:   12 month(s)  Provider:   BRIAN CRENSHAW MD    

## 2022-09-12 DIAGNOSIS — F411 Generalized anxiety disorder: Secondary | ICD-10-CM | POA: Diagnosis not present

## 2022-09-12 DIAGNOSIS — G4700 Insomnia, unspecified: Secondary | ICD-10-CM | POA: Diagnosis not present

## 2022-09-20 DIAGNOSIS — H2512 Age-related nuclear cataract, left eye: Secondary | ICD-10-CM | POA: Diagnosis not present

## 2022-09-20 DIAGNOSIS — H524 Presbyopia: Secondary | ICD-10-CM | POA: Diagnosis not present

## 2022-10-19 ENCOUNTER — Encounter: Payer: Self-pay | Admitting: *Deleted

## 2022-11-15 DIAGNOSIS — E559 Vitamin D deficiency, unspecified: Secondary | ICD-10-CM | POA: Diagnosis not present

## 2022-11-15 DIAGNOSIS — E781 Pure hyperglyceridemia: Secondary | ICD-10-CM | POA: Diagnosis not present

## 2022-11-15 DIAGNOSIS — R700 Elevated erythrocyte sedimentation rate: Secondary | ICD-10-CM | POA: Diagnosis not present

## 2022-11-15 DIAGNOSIS — R7303 Prediabetes: Secondary | ICD-10-CM | POA: Diagnosis not present

## 2022-11-22 DIAGNOSIS — J45909 Unspecified asthma, uncomplicated: Secondary | ICD-10-CM | POA: Diagnosis not present

## 2022-11-22 DIAGNOSIS — Z6828 Body mass index (BMI) 28.0-28.9, adult: Secondary | ICD-10-CM | POA: Diagnosis not present

## 2022-11-22 DIAGNOSIS — E559 Vitamin D deficiency, unspecified: Secondary | ICD-10-CM | POA: Diagnosis not present

## 2022-11-22 DIAGNOSIS — E7800 Pure hypercholesterolemia, unspecified: Secondary | ICD-10-CM | POA: Diagnosis not present

## 2022-11-22 DIAGNOSIS — R700 Elevated erythrocyte sedimentation rate: Secondary | ICD-10-CM | POA: Diagnosis not present

## 2022-11-22 DIAGNOSIS — Z23 Encounter for immunization: Secondary | ICD-10-CM | POA: Diagnosis not present

## 2022-11-22 DIAGNOSIS — R7303 Prediabetes: Secondary | ICD-10-CM | POA: Diagnosis not present

## 2022-11-22 DIAGNOSIS — Z0000 Encounter for general adult medical examination without abnormal findings: Secondary | ICD-10-CM | POA: Diagnosis not present

## 2022-12-26 ENCOUNTER — Other Ambulatory Visit: Payer: Self-pay

## 2022-12-26 ENCOUNTER — Observation Stay (HOSPITAL_BASED_OUTPATIENT_CLINIC_OR_DEPARTMENT_OTHER)
Admission: EM | Admit: 2022-12-26 | Discharge: 2022-12-29 | Disposition: A | Discharge status: Home / Self Care | Payer: Medicare Other | Attending: Emergency Medicine | Admitting: Emergency Medicine

## 2022-12-26 ENCOUNTER — Emergency Department (HOSPITAL_BASED_OUTPATIENT_CLINIC_OR_DEPARTMENT_OTHER): Payer: Medicare Other

## 2022-12-26 ENCOUNTER — Encounter (HOSPITAL_BASED_OUTPATIENT_CLINIC_OR_DEPARTMENT_OTHER): Payer: Self-pay

## 2022-12-26 DIAGNOSIS — Z79899 Other long term (current) drug therapy: Secondary | ICD-10-CM | POA: Diagnosis not present

## 2022-12-26 DIAGNOSIS — I2699 Other pulmonary embolism without acute cor pulmonale: Principal | ICD-10-CM | POA: Insufficient documentation

## 2022-12-26 DIAGNOSIS — I10 Essential (primary) hypertension: Secondary | ICD-10-CM

## 2022-12-26 DIAGNOSIS — M25512 Pain in left shoulder: Secondary | ICD-10-CM | POA: Insufficient documentation

## 2022-12-26 DIAGNOSIS — M549 Dorsalgia, unspecified: Secondary | ICD-10-CM | POA: Diagnosis not present

## 2022-12-26 DIAGNOSIS — D649 Anemia, unspecified: Secondary | ICD-10-CM | POA: Diagnosis not present

## 2022-12-26 DIAGNOSIS — R002 Palpitations: Secondary | ICD-10-CM | POA: Diagnosis not present

## 2022-12-26 DIAGNOSIS — Z933 Colostomy status: Secondary | ICD-10-CM | POA: Diagnosis not present

## 2022-12-26 DIAGNOSIS — K5730 Diverticulosis of large intestine without perforation or abscess without bleeding: Secondary | ICD-10-CM | POA: Diagnosis not present

## 2022-12-26 DIAGNOSIS — M545 Low back pain, unspecified: Secondary | ICD-10-CM | POA: Insufficient documentation

## 2022-12-26 DIAGNOSIS — M542 Cervicalgia: Secondary | ICD-10-CM | POA: Diagnosis not present

## 2022-12-26 DIAGNOSIS — R079 Chest pain, unspecified: Secondary | ICD-10-CM | POA: Diagnosis not present

## 2022-12-26 DIAGNOSIS — R918 Other nonspecific abnormal finding of lung field: Secondary | ICD-10-CM | POA: Diagnosis not present

## 2022-12-26 DIAGNOSIS — I700 Atherosclerosis of aorta: Secondary | ICD-10-CM | POA: Diagnosis not present

## 2022-12-26 LAB — BASIC METABOLIC PANEL
Anion gap: 8 (ref 5–15)
BUN: 17 mg/dL (ref 8–23)
CO2: 29 mmol/L (ref 22–32)
Calcium: 9.3 mg/dL (ref 8.9–10.3)
Chloride: 102 mmol/L (ref 98–111)
Creatinine, Ser: 0.79 mg/dL (ref 0.44–1.00)
GFR, Estimated: >60 mL/min (ref >60)
Glucose, Bld: 105 mg/dL — ABNORMAL HIGH (ref 70–99)
Potassium: 3.9 mmol/L (ref 3.5–5.1)
Sodium: 139 mmol/L (ref 135–145)

## 2022-12-26 LAB — CBC
HCT: 37.4 % (ref 36.0–46.0)
Hemoglobin: 12.2 g/dL (ref 12.0–15.0)
MCH: 30.6 pg (ref 26.0–34.0)
MCHC: 32.6 g/dL (ref 30.0–36.0)
MCV: 93.7 fL (ref 80.0–100.0)
Platelets: 302 10*3/uL (ref 150–400)
RBC: 3.99 MIL/uL (ref 3.87–5.11)
RDW: 12.6 % (ref 11.5–15.5)
WBC: 9.7 10*3/uL (ref 4.0–10.5)
nRBC: 0 % (ref 0.0–0.2)

## 2022-12-26 LAB — TROPONIN I (HIGH SENSITIVITY)
Troponin I (High Sensitivity): 6 ng/L (ref <18)
Troponin I (High Sensitivity): 9 ng/L (ref <18)

## 2022-12-26 MED ORDER — APIXABAN 5 MG PO TABS
10.0000 mg | ORAL_TABLET | Freq: Two times a day (BID) | ORAL | Status: DC
  Administered 2022-12-27 – 2022-12-29 (×6): 10 mg via ORAL
  Filled 2022-12-26 (×2): qty 2
  Filled 2022-12-26: qty 4
  Filled 2022-12-26: qty 2
  Filled 2022-12-26: qty 4
  Filled 2022-12-26: qty 2

## 2022-12-26 MED ORDER — MORPHINE SULFATE (PF) 4 MG/ML IV SOLN
4.0000 mg | Freq: Once | INTRAVENOUS | Status: AC
  Administered 2022-12-26: 4 mg via INTRAVENOUS
  Filled 2022-12-26: qty 1

## 2022-12-26 MED ORDER — LABETALOL HCL 5 MG/ML IV SOLN
5.0000 mg | Freq: Once | INTRAVENOUS | Status: AC
  Administered 2022-12-27: 5 mg via INTRAVENOUS
  Filled 2022-12-26: qty 4

## 2022-12-26 MED ORDER — IOHEXOL 350 MG/ML SOLN
100.0000 mL | Freq: Once | INTRAVENOUS | Status: AC | PRN
  Administered 2022-12-26: 100 mL via INTRAVENOUS

## 2022-12-26 MED ORDER — ONDANSETRON HCL 4 MG/2ML IJ SOLN
4.0000 mg | Freq: Once | INTRAMUSCULAR | Status: AC
  Administered 2022-12-26: 4 mg via INTRAVENOUS
  Filled 2022-12-26: qty 2

## 2022-12-26 MED ORDER — METHOCARBAMOL 500 MG PO TABS
500.0000 mg | ORAL_TABLET | Freq: Once | ORAL | Status: AC
  Administered 2022-12-26: 500 mg via ORAL
  Filled 2022-12-26: qty 1

## 2022-12-26 MED ORDER — MORPHINE SULFATE (PF) 4 MG/ML IV SOLN
4.0000 mg | Freq: Once | INTRAVENOUS | Status: AC
  Administered 2022-12-27: 4 mg via INTRAVENOUS
  Filled 2022-12-26: qty 1

## 2022-12-26 NOTE — ED Notes (Signed)
O2 90% on RA. Encouraged to take periodic deep breaths. O2 returns to 99%. RT at bedside for eval.

## 2022-12-26 NOTE — ED Triage Notes (Signed)
Pain between shoulder blades, elevated BP, states she is supposed to have MRI for neck pain that she believes is causing left arm pain. Was seen at [facility] and sent here to rule out cardiac condition.

## 2022-12-26 NOTE — ED Provider Notes (Signed)
[facility] EMERGENCY DEPARTMENT AT [facility] Provider Note   CSN: [id number] Arrival date & time: 12/26/22  1858     History  Chief Complaint  Patient presents with   Hypertension   Back Pain    Dawn Payne is a 77 y.o. female.  Patient is a 77 year old female with a history of anemia, palpitations, hypertriglyceridemia who is presenting today from the Ortho office due to elevated blood pressure and shoulder blade pain.  Patient reports that yesterday she was lifting heavy things over her head getting them out of the closet which she knows she is not supposed to do but woke up in the middle the night with severe pain in her shoulder blades that radiates down her left arm causing it to tingle as well as pain radiating down into her back.  She went to the orthopedist today and had x-rays done and was found to be hypertensive.  She had taken Tylenol prior and they had done an injection in the office but wrist ports still ongoing pain.  She also felt like she may have had a little bit of chest pain while in the office.  She denies any numbness or tingling in her legs and the pain does not radiate into her abdomen.  However she reports that she has been noted to have elevated blood pressure while in the office but it seems better at home however today the blood pressure was still elevated even at home it was 178 systolic.  The orthopedist wanted her to be evaluated to make sure everything with her heart was okay but then they had ordered her medications for suspected MSK pain and spasm.  Patient is denying any shortness of breath at this time.  The pain is reproduced with movement.  The history is provided by the patient and medical records.  Hypertension  Back Pain      Home Medications Prior to Admission medications   Medication Sig Start Date End Date Taking? Authorizing Provider  albuterol (VENTOLIN HFA) 108 (90 Base) MCG/ACT inhaler as needed.    [provider]   ALPRAZolam Prudy Feeler) 0.5 MG tablet Take 0.5 mg by mouth at bedtime as needed for sleep.    [provider]  B Complex Vitamins (VITAMIN-B COMPLEX PO) Take 1 tablet by mouth daily. Reported on 07/27/2015    [provider]  ergocalciferol (VITAMIN D2) 50000 UNITS capsule Take 50,000 Units by mouth once a week.    [provider]  ezetimibe (ZETIA) 10 MG tablet Take 1 tablet (10 mg total) by mouth daily. 08/03/22   Lewayne Bunting, MD  OVER THE COUNTER MEDICATION Take 1 capsule by mouth daily. Move free joint supplement    [provider]  Probiotic Product (PROBIOTIC DAILY PO) Take 2 tablets by mouth daily.    [provider]  vitamin C (ASCORBIC ACID) 500 MG tablet Take 500 mg by mouth daily. [phone] mg a day    [provider]      Allergies    Sulfonamide derivatives and Statins    Review of Systems   Review of Systems  Musculoskeletal:  Positive for back pain.    Physical Exam Updated Vital Signs BP (!) 192/80 (BP Location: Left Arm)   Pulse 71   Temp 98.1 F (36.7 C)   Resp 14   Ht 5\' 4"  (1.626 m)   Wt 68 kg   SpO2 98%   BMI 25.75 kg/m  Physical Exam Vitals and nursing  note reviewed.  Constitutional:      General: She is not in acute distress.    Appearance: She is well-developed.  HENT:     Head: Normocephalic and atraumatic.  Eyes:     Pupils: Pupils are equal, round, and reactive to light.  Neck:      Comments: No palpable spasm and not able to reproduce the pain by palpation but can reproduce with movement of the left arm Cardiovascular:     Rate and Rhythm: Normal rate and regular rhythm.     Pulses: Normal pulses.     Heart sounds: Normal heart sounds. No murmur heard.    No friction rub.  Pulmonary:     Effort: Pulmonary effort is normal.     Breath sounds: Normal breath sounds. No wheezing or rales.  Abdominal:     General: Bowel sounds are normal. There is no distension.     Palpations: Abdomen is  soft.     Tenderness: There is no abdominal tenderness. There is no guarding or rebound.  Musculoskeletal:        General: No tenderness. Normal range of motion.     Cervical back: Normal range of motion and neck supple.     Right lower leg: No edema.     Left lower leg: No edema.     Comments: No edema  Skin:    General: Skin is warm and dry.     Findings: No rash.  Neurological:     Mental Status: She is alert and oriented to person, place, and time. Mental status is at baseline.     Cranial Nerves: No cranial nerve deficit.     Sensory: No sensory deficit.     Motor: No weakness.     Gait: Gait normal.  Psychiatric:        Behavior: Behavior normal.     ED Results / Procedures / Treatments   Labs (all labs ordered are listed, but only abnormal results are displayed) Labs Reviewed  BASIC METABOLIC PANEL - Abnormal; Notable for the following components:      Result Value   Glucose, Bld 105 (*)    All other components within normal limits  CBC  TROPONIN I (HIGH SENSITIVITY)  TROPONIN I (HIGH SENSITIVITY)    EKG EKG Interpretation Date/Time:  Wednesday December 26 2022 19:51:36 EST Ventricular Rate:  74 PR Interval:  118 QRS Duration:  82 QT Interval:  370 QTC Calculation: 410 R Axis:   43  Text Interpretation: Sinus rhythm with Blocked Premature atrial complexes with Premature supraventricular complexes Nonspecific ST and T wave abnormality When compared with ECG of 03-Aug-2022 08:50, Premature ventricular complexes are no longer Present Premature supraventricular complexes are now Present Criteria for Septal infarct are no longer Present Confirmed by Gwyneth Sprout ([id number]) on 12/26/2022 8:01:59 PM  Radiology CT Angio Chest/Abd/Pel for Dissection W and/or Wo Contrast  Addendum Date: 12/26/2022   ADDENDUM REPORT: 12/26/2022 22:57 ADDENDUM: Critical Value/emergent results were called by telephone at the time of interpretation on 12/26/2022 at 10:57 pm to provider  [facility] , who verbally acknowledged these results. Electronically Signed   By: Thornell Sartorius M.D.   On: 12/26/2022 22:57   Result Date: 12/26/2022 CLINICAL DATA:  Sudden back pain, chest pain, and hypertension. EXAM: CT ANGIOGRAPHY CHEST, ABDOMEN AND PELVIS TECHNIQUE: Non-contrast CT of the chest was initially obtained. Multidetector CT imaging through the chest, abdomen and pelvis was performed using the standard protocol during bolus administration of intravenous contrast.  Multiplanar reconstructed images and MIPs were obtained and reviewed to evaluate the vascular anatomy. RADIATION DOSE REDUCTION: This exam was performed according to the departmental dose-optimization program which includes automated exposure control, adjustment of the mA and/or kV according to patient size and/or use of iterative reconstruction technique. CONTRAST:  OMNIPAQUE IOHEXOL 350 MG/ML SOLN COMPARISON:  02/20/2019. FINDINGS: CTA CHEST FINDINGS Cardiovascular: The heart is normal in size and there is no pericardial effusion. There is atherosclerotic calcification of the aorta without evidence of aneurysm or dissection. The pulmonary trunk is normal in caliber. There are small pulmonary emboli in the segmental and subsegmental pulmonary arteries in the right lower lobe and right upper lobe. No evidence of right heart strain is seen. Mediastinum/Nodes: No enlarged mediastinal, hilar, or axillary lymph nodes. Thyroid gland, trachea, and esophagus demonstrate no significant findings. Lungs/Pleura: Mild atelectasis or scarring is noted at the lung bases. No effusion or pneumothorax. Musculoskeletal: Degenerative changes are present in the thoracic spine. No acute osseous abnormality. Review of the MIP images confirms the above findings. CTA ABDOMEN AND PELVIS FINDINGS VASCULAR Aorta: Normal caliber aorta without aneurysm, dissection, vasculitis or significant stenosis. Aortic atherosclerosis. Celiac: Patent without evidence  of aneurysm, dissection, vasculitis or significant stenosis. SMA: Patent without evidence of aneurysm, dissection, vasculitis or significant stenosis. Renals: Both renal arteries are patent without evidence of aneurysm, dissection, vasculitis, fibromuscular dysplasia or significant stenosis. IMA: Patent. Inflow: Patent without evidence of aneurysm, dissection, vasculitis or significant stenosis. Veins: No obvious venous abnormality within the limitations of this arterial phase study. Review of the MIP images confirms the above findings. NON-VASCULAR Hepatobiliary: No focal liver abnormality is seen. No gallstones, gallbladder wall thickening, or biliary dilatation. Pancreas: Unremarkable. No pancreatic ductal dilatation or surrounding inflammatory changes. Spleen: Normal in size without focal abnormality. Adrenals/Urinary Tract: The adrenal glands are within normal limits. The kidneys enhance symmetrically. No renal or ureteral calculus or obstructive uropathy bilaterally. The bladder is unremarkable. Stomach/Bowel: Stomach is within normal limits. Appendix appears normal. No evidence of bowel wall thickening, distention, or inflammatory changes. No free air or pneumatosis. Scattered diverticula are present along the colon without evidence of diverticulitis. Lymphatic: No abdominal or pelvic lymphadenopathy. Reproductive: Uterus and bilateral adnexa are unremarkable. Other: No abdominopelvic ascites. Musculoskeletal: Degenerative changes are present in the lumbar spine. No acute osseous abnormality. Review of the MIP images confirms the above findings. IMPRESSION: 1. Small segmental and subsegmental pulmonary emboli in the right upper and lower lobes. No evidence of right heart strain. 2. Aortic atherosclerosis without evidence aneurysm or dissection. 3. Colonic diverticulosis without diverticulitis. Electronically Signed: By: Thornell Sartorius M.D. On: 12/26/2022 22:53   DG Chest Port 1 View  Result Date:  12/26/2022 CLINICAL DATA:  Hypertension.  Pain. EXAM: PORTABLE CHEST 1 VIEW COMPARISON:  05/06/2020 FINDINGS: Stable heart size and mediastinal contours. Aortic atherosclerosis. Subsegmental opacity in the lung bases. No pleural effusion or pneumothorax. Normal pulmonary vasculature. Slight scoliotic curvature of the spine. No acute osseous findings. IMPRESSION: Subsegmental opacity in the lung bases, likely atelectasis. Electronically Signed   By: Narda Rutherford M.D.   On: 12/26/2022 22:08    Procedures Procedures    Medications Ordered in ED Medications  morphine (PF) 4 MG/ML injection 4 mg (has no administration in time range)  apixaban (ELIQUIS) tablet 10 mg (has no administration in time range)  labetalol (NORMODYNE) injection 5 mg (has no administration in time range)  morphine (PF) 4 MG/ML injection 4 mg (4 mg Intravenous Given 12/26/22 2026)  ondansetron (  ZOFRAN) injection 4 mg (4 mg Intravenous Given 12/26/22 2026)  iohexol (OMNIPAQUE) 350 MG/ML injection 100 mL (100 mLs Intravenous Contrast Given 12/26/22 2051)  methocarbamol (ROBAXIN) tablet 500 mg (500 mg Oral Given 12/26/22 2214)    ED Course/ Medical Decision Making/ A&P                                 Medical Decision Making Amount and/or Complexity of Data Reviewed External Data Reviewed: notes. Labs: ordered. Decision-making details documented in ED Course. Radiology: ordered and independent interpretation performed. Decision-making details documented in ED Course. ECG/medicine tests: ordered and independent interpretation performed. Decision-making details documented in ED Course.  Risk Prescription drug management.   Pt with multiple medical problems and comorbidities and presenting today with a complaint that caries a high risk for morbidity and mortality.  Here today with sudden onset of severe pain between her shoulder blades.  Patient is also noted to be hypertensive today which she does not carry a history  of hypertension and does not take medication for blood pressure.  Concern for dissection versus elevated blood pressure due to pain related to musculoskeletal source.  Lower suspicion for ACS and hypertensive emergency.  Will give pain control and continue to monitor blood pressure.  I independently interpreted patient's EKG which shows frequent PVCs but no other acute changes.  Patient does report following up with a cardiologist this year and having a calcium score of 0 no known heart disease.  11:41 PM I independently interpreted patient's labs and troponin is negative, BMP without acute findings, CBC within normal limits.  I have independently visualized and interpreted pt's images today.  Chest x-ray without acute findings today.  Will do a CTA to further evaluate for dissection.  Patient did not have significant pain relief with morphine and was given Robaxin.  Also a heating pad.  CTA without evidence of dissection today however radiology called and reports that patient has a small subsegmental and segmental pulmonary embolus in the right upper and lower lobes without evidence of heart strain.  However no evidence of aneurysm or dissection.  Unclear if that is the cause of patient's pain today or if it is still musculoskeletal in nature.  However on repeat evaluation patient is still hypertensive at 192/80.  No contraindications to start anticoagulation and patient was started on Eliquis.  She states she has been at the hospital a lot with her husband who has had multiple surgeries and she has had to sometimes sit for up to 15 hours but otherwise she herself has had no surgeries or prolonged trips.  She at one point did remember having some pain behind her of her knees but cannot remember which 1 and is not having it currently.  No prior history of clot.  Patient given further pain control and some labetalol for her hypertension.  Given above findings feel that patient would benefit from admission for  further workup.  Consulted hospitalist for admission.  CRITICAL CARE Performed by: Edna Rede Total critical care time: 30 minutes Critical care time was exclusive of separately billable procedures and treating other patients. Critical care was necessary to treat or prevent imminent or life-threatening deterioration. Critical care was time spent personally by me on the following activities: development of treatment plan with patient and/or surrogate as well as nursing, discussions with consultants, evaluation of patient's response to treatment, examination of patient, obtaining history from patient or surrogate,  ordering and performing treatments and interventions, ordering and review of laboratory studies, ordering and review of radiographic studies, pulse oximetry and re-evaluation of patient's condition.           Final Clinical Impression(s) / ED Diagnoses Final diagnoses:  Other acute pulmonary embolism without acute cor pulmonale (HCC)  Uncontrolled hypertension  Acute pain of left shoulder    Rx / DC Orders ED Discharge Orders     None         Gwyneth Sprout, MD 12/26/22 2342

## 2022-12-27 DIAGNOSIS — Z933 Colostomy status: Secondary | ICD-10-CM | POA: Diagnosis not present

## 2022-12-27 DIAGNOSIS — I2699 Other pulmonary embolism without acute cor pulmonale: Secondary | ICD-10-CM | POA: Diagnosis not present

## 2022-12-27 DIAGNOSIS — R002 Palpitations: Secondary | ICD-10-CM | POA: Diagnosis not present

## 2022-12-27 DIAGNOSIS — I10 Essential (primary) hypertension: Secondary | ICD-10-CM | POA: Diagnosis not present

## 2022-12-27 DIAGNOSIS — D649 Anemia, unspecified: Secondary | ICD-10-CM | POA: Diagnosis not present

## 2022-12-27 DIAGNOSIS — M25512 Pain in left shoulder: Secondary | ICD-10-CM | POA: Diagnosis not present

## 2022-12-27 DIAGNOSIS — Z79899 Other long term (current) drug therapy: Secondary | ICD-10-CM | POA: Diagnosis not present

## 2022-12-27 DIAGNOSIS — M545 Low back pain, unspecified: Secondary | ICD-10-CM | POA: Diagnosis not present

## 2022-12-27 LAB — LIPID PANEL
Cholesterol: 201 mg/dL — ABNORMAL HIGH (ref 0–200)
HDL: 48 mg/dL (ref >40)
LDL Cholesterol: 96 mg/dL (ref 0–99)
Total CHOL/HDL Ratio: 4.2 {ratio}
Triglycerides: 287 mg/dL — ABNORMAL HIGH (ref <150)
VLDL: 57 mg/dL — ABNORMAL HIGH (ref 0–40)

## 2022-12-27 MED ORDER — METOPROLOL TARTRATE 12.5 MG HALF TABLET
12.5000 mg | ORAL_TABLET | Freq: Two times a day (BID) | ORAL | Status: DC
  Administered 2022-12-28: 12.5 mg via ORAL
  Filled 2022-12-27: qty 1

## 2022-12-27 MED ORDER — HYDRALAZINE HCL 20 MG/ML IJ SOLN
10.0000 mg | INTRAMUSCULAR | Status: DC | PRN
  Administered 2022-12-29: 10 mg via INTRAVENOUS
  Filled 2022-12-27: qty 1

## 2022-12-27 MED ORDER — METHOCARBAMOL 500 MG PO TABS
500.0000 mg | ORAL_TABLET | Freq: Four times a day (QID) | ORAL | Status: DC | PRN
  Administered 2022-12-27 – 2022-12-29 (×5): 500 mg via ORAL
  Filled 2022-12-27 (×5): qty 1

## 2022-12-27 MED ORDER — ALPRAZOLAM 0.5 MG PO TABS
0.5000 mg | ORAL_TABLET | Freq: Every evening | ORAL | Status: DC | PRN
  Administered 2022-12-27 – 2022-12-28 (×2): 0.5 mg via ORAL
  Filled 2022-12-27 (×2): qty 1

## 2022-12-27 MED ORDER — PANTOPRAZOLE SODIUM 40 MG PO TBEC
40.0000 mg | DELAYED_RELEASE_TABLET | Freq: Every day | ORAL | Status: DC
  Administered 2022-12-27 – 2022-12-29 (×3): 40 mg via ORAL
  Filled 2022-12-27 (×3): qty 1

## 2022-12-27 MED ORDER — EZETIMIBE 10 MG PO TABS
10.0000 mg | ORAL_TABLET | Freq: Every day | ORAL | Status: DC
  Filled 2022-12-27: qty 1

## 2022-12-27 MED ORDER — METOPROLOL TARTRATE 25 MG PO TABS
25.0000 mg | ORAL_TABLET | Freq: Two times a day (BID) | ORAL | Status: DC
  Administered 2022-12-27: 25 mg via ORAL
  Filled 2022-12-27: qty 1

## 2022-12-27 NOTE — ED Notes (Signed)
Patient placed on 2LNC at this time due to desaturations while sleeping.

## 2022-12-27 NOTE — ED Notes (Signed)
Lauren with cl called for transport 

## 2022-12-27 NOTE — H&P (Addendum)
History and Physical    Dawn Payne:[id number] DOB: March 03, 1945 DOA: 12/26/2022  PCP: Daisy Floro, MD  Patient coming from: home  I have personally briefly reviewed patient's old medical records in [facility]  Chief Complaint: uncontrolled bp / back pain   HPI: Dawn Payne is a 77 y.o. female with medical history significant of  Anemia, Anxiety, Palpitations, HLD who presents in referral from [facility] due to noted uncontrolled blood pressure with associated back pain for further evaluation.Patient states prior to onset of symptoms she was moving heavy boxes. She states pain however started while she was sleeping. She states she thought it was related to strain from lifting heavy boxes so she presented to ortho office.  On evaluation patient was found to have elevated blood pressure and referred to ED. Patient also noted associated radiation of pain down her back and down her left arm. Patient also endorsed fleeting chest discomfort as well. She notes no n/v/ diaphoresis fever/chills / or sob. She however does mention episode of calf pain. She notes no tobacco or hormone treatment but states lately she has had a period of prolonged immobility.  ED Course:  Afeb , bp 200/80, rr16, hr 76, sat 100% on ra  EKG:nsr ,pvc  Labs: Wbc 9.7, hgb 12.2, plt 302  Na 139, K 3.9,, cL 102, glu 105, cr 0.79 Ce 6,9 Cxr IMPRESSION: Subsegmental opacity in the lung bases, likely atelectasis. CTA:1. Small segmental and subsegmental pulmonary emboli in the right upper and lower lobes. No evidence of right heart strain. 2. Aortic atherosclerosis without evidence aneurysm or dissection. 3. Colonic diverticulosis without diverticulitis.  TX Zofran ,morphine, eliquis, labetolol Review of Systems: As per HPI otherwise 10 point review of systems negative.   Past Medical History:  Diagnosis Date   Anemia    Anxiety    pt denies at this time 07-27-15   Arthritis    ddd   Cardiac  arrhythmia    palpitation   Colon polyps    adenomatous   Diverticulitis    Diverticulosis    Hypertriglyceridemia    Pneumonia    Vitamin D deficiency     Past Surgical History:  Procedure Laterality Date   COLECTOMY  2008   due to kink from popcorn kernal   COLONOSCOPY     MENISCUS REPAIR Left    TONSILLECTOMY AND ADENOIDECTOMY       reports that she has never smoked. She has never used smokeless tobacco. She reports current alcohol use. She reports that she does not use drugs.  Allergies  Allergen Reactions   Sulfonamide Derivatives Other (See Comments)    Pt states she turns bright red   Statins     Family History  Problem Relation Age of Onset   Hypertension Mother    Kidney disease Mother    Heart disease Mother    Prostate cancer Father    Arthritis Sister    Colon cancer Neg Hx    Colon polyps Neg Hx    Esophageal cancer Neg Hx    Rectal cancer Neg Hx    Stomach cancer Neg Hx     Prior to Admission medications   Medication Sig Start Date End Date Taking? Authorizing Provider  ALPRAZolam Prudy Feeler) 0.5 MG tablet Take 0.5 mg by mouth as needed for sleep. *take 3 times a week*   Yes [provider]  albuterol (VENTOLIN HFA) 108 (90 Base) MCG/ACT inhaler as needed.    [provider]  ezetimibe (ZETIA) 10 MG tablet Take 1 tablet (10 mg total) by mouth daily. Patient not taking: Reported on 12/27/2022 08/03/22   Lewayne Bunting, MD  predniSONE (STERAPRED UNI-PAK 21 TAB) 5 MG (21) TBPK tablet Take 5 mg by mouth. Taper dose 12/26/22   [provider]  [facility] injection Inject 0.5 mLs into the muscle once. 11/15/22   [provider]    Physical Exam: Vitals:   12/27/22 0928 12/27/22 1000 12/27/22 1256 12/27/22 1430  BP:  (!) 150/51 (!) 184/71 (!) 188/63  Pulse:  67 72 67  Resp:  14 16 17   Temp: 98.4 F (36.9 C)  99 F (37.2 C)   TempSrc: Oral  Oral   SpO2:  91% 92% 95%  Weight:   73.6 kg   Height:   5' 3.5" (1.613 m)      Constitutional: NAD, calm, comfortable Vitals:   12/27/22 0928 12/27/22 1000 12/27/22 1256 12/27/22 1430  BP:  (!) 150/51 (!) 184/71 (!) 188/63  Pulse:  67 72 67  Resp:  14 16 17   Temp: 98.4 F (36.9 C)  99 F (37.2 C)   TempSrc: Oral  Oral   SpO2:  91% 92% 95%  Weight:   73.6 kg   Height:   5' 3.5" (1.613 m)    Eyes: PERRL, lids and conjunctivae normal ENMT: Mucous membranes are moist. Posterior pharynx clear of any exudate or lesions.Normal dentition.  Neck: normal, supple, no masses, no thyromegaly Respiratory: clear to auscultation bilaterally, no wheezing, no crackles. Normal respiratory effort. No accessory muscle use.  Cardiovascular: Regular rate and rhythm, no murmurs / rubs / gallops. No extremity edema. 2+ pedal pulses.  Abdomen: no tenderness, no masses palpated. No hepatosplenomegaly. Bowel sounds positive.  Musculoskeletal: no clubbing / cyanosis. No joint deformity upper and lower extremities. Good ROM, no contractures. Normal muscle tone.  Skin: no rashes, lesions, ulcers. No induration Neurologic: CN 2-12 grossly intact. Sensation intact. Strength 5/5 in all 4.  Psychiatric: Normal judgment and insight. Alert and oriented x 3. Normal mood.    Labs on Admission: I have personally reviewed following labs and imaging studies  CBC: Recent Labs  Lab 12/26/22 1953  WBC 9.7  HGB 12.2  HCT 37.4  MCV 93.7  PLT 302   Basic Metabolic Panel: Recent Labs  Lab 12/26/22 1953  NA 139  K 3.9  CL 102  CO2 29  GLUCOSE 105*  BUN 17  CREATININE 0.79  CALCIUM 9.3   GFR: Estimated Creatinine Clearance: 56.4 mL/min (by C-G formula based on SCr of 0.79 mg/dL). Liver Function Tests: No results for input(s): "AST", "ALT", "ALKPHOS", "BILITOT", "PROT", "ALBUMIN" in the last 168 hours. No results for input(s): "LIPASE", "AMYLASE" in the last 168 hours. No results for input(s): "AMMONIA" in the last 168 hours. Coagulation Profile: No results for input(s): "INR",  "PROTIME" in the last 168 hours. Cardiac Enzymes: No results for input(s): "CKTOTAL", "CKMB", "CKMBINDEX", "TROPONINI" in the last 168 hours. BNP (last 3 results) No results for input(s): "PROBNP" in the last 8760 hours. HbA1C: No results for input(s): "HGBA1C" in the last 72 hours. CBG: No results for input(s): "GLUCAP" in the last 168 hours. Lipid Profile: No results for input(s): "CHOL", "HDL", "LDLCALC", "TRIG", "CHOLHDL", "LDLDIRECT" in the last 72 hours. Thyroid Function Tests: No results for input(s): "TSH", "T4TOTAL", "FREET4", "T3FREE", "THYROIDAB" in the last 72 hours. Anemia Panel: No results for input(s): "VITAMINB12", "FOLATE", "FERRITIN", "TIBC", "IRON", "RETICCTPCT" in the last 72 hours.  Urine analysis:    Component Value Date/Time   COLORURINE YELLOW 12/04/2006 1000   APPEARANCEUR CLEAR 12/04/2006 1000   LABSPEC 1.024 12/04/2006 1000   PHURINE 6.0 12/04/2006 1000   GLUCOSEU NEGATIVE 12/04/2006 1000   HGBUR NEGATIVE 12/04/2006 1000   BILIRUBINUR NEGATIVE 12/04/2006 1000   KETONESUR NEGATIVE 12/04/2006 1000   PROTEINUR NEGATIVE 12/04/2006 1000   UROBILINOGEN 0.2 12/04/2006 1000   NITRITE NEGATIVE 12/04/2006 1000   LEUKOCYTESUR  12/04/2006 1000    NEGATIVE MICROSCOPIC NOT DONE ON URINES WITH NEGATIVE PROTEIN, BLOOD, LEUKOCYTES, NITRITE, OR GLUCOSE <1000 mg/dL.    Radiological Exams on Admission: CT Angio Chest/Abd/Pel for Dissection W and/or Wo Contrast  Addendum Date: 12/26/2022   ADDENDUM REPORT: 12/26/2022 22:57 ADDENDUM: Critical Value/emergent results were called by telephone at the time of interpretation on 12/26/2022 at 10:57 pm to provider [facility] , who verbally acknowledged these results. Electronically Signed   By: Thornell Sartorius M.D.   On: 12/26/2022 22:57   Result Date: 12/26/2022 CLINICAL DATA:  Sudden back pain, chest pain, and hypertension. EXAM: CT ANGIOGRAPHY CHEST, ABDOMEN AND PELVIS TECHNIQUE: Non-contrast CT of the chest was initially  obtained. Multidetector CT imaging through the chest, abdomen and pelvis was performed using the standard protocol during bolus administration of intravenous contrast. Multiplanar reconstructed images and MIPs were obtained and reviewed to evaluate the vascular anatomy. RADIATION DOSE REDUCTION: This exam was performed according to the departmental dose-optimization program which includes automated exposure control, adjustment of the mA and/or kV according to patient size and/or use of iterative reconstruction technique. CONTRAST:  OMNIPAQUE IOHEXOL 350 MG/ML SOLN COMPARISON:  02/20/2019. FINDINGS: CTA CHEST FINDINGS Cardiovascular: The heart is normal in size and there is no pericardial effusion. There is atherosclerotic calcification of the aorta without evidence of aneurysm or dissection. The pulmonary trunk is normal in caliber. There are small pulmonary emboli in the segmental and subsegmental pulmonary arteries in the right lower lobe and right upper lobe. No evidence of right heart strain is seen. Mediastinum/Nodes: No enlarged mediastinal, hilar, or axillary lymph nodes. Thyroid gland, trachea, and esophagus demonstrate no significant findings. Lungs/Pleura: Mild atelectasis or scarring is noted at the lung bases. No effusion or pneumothorax. Musculoskeletal: Degenerative changes are present in the thoracic spine. No acute osseous abnormality. Review of the MIP images confirms the above findings. CTA ABDOMEN AND PELVIS FINDINGS VASCULAR Aorta: Normal caliber aorta without aneurysm, dissection, vasculitis or significant stenosis. Aortic atherosclerosis. Celiac: Patent without evidence of aneurysm, dissection, vasculitis or significant stenosis. SMA: Patent without evidence of aneurysm, dissection, vasculitis or significant stenosis. Renals: Both renal arteries are patent without evidence of aneurysm, dissection, vasculitis, fibromuscular dysplasia or significant stenosis. IMA: Patent. Inflow: Patent  without evidence of aneurysm, dissection, vasculitis or significant stenosis. Veins: No obvious venous abnormality within the limitations of this arterial phase study. Review of the MIP images confirms the above findings. NON-VASCULAR Hepatobiliary: No focal liver abnormality is seen. No gallstones, gallbladder wall thickening, or biliary dilatation. Pancreas: Unremarkable. No pancreatic ductal dilatation or surrounding inflammatory changes. Spleen: Normal in size without focal abnormality. Adrenals/Urinary Tract: The adrenal glands are within normal limits. The kidneys enhance symmetrically. No renal or ureteral calculus or obstructive uropathy bilaterally. The bladder is unremarkable. Stomach/Bowel: Stomach is within normal limits. Appendix appears normal. No evidence of bowel wall thickening, distention, or inflammatory changes. No free air or pneumatosis. Scattered diverticula are present along the colon without evidence of diverticulitis. Lymphatic: No abdominal or pelvic lymphadenopathy. Reproductive: Uterus and bilateral adnexa are  unremarkable. Other: No abdominopelvic ascites. Musculoskeletal: Degenerative changes are present in the lumbar spine. No acute osseous abnormality. Review of the MIP images confirms the above findings. IMPRESSION: 1. Small segmental and subsegmental pulmonary emboli in the right upper and lower lobes. No evidence of right heart strain. 2. Aortic atherosclerosis without evidence aneurysm or dissection. 3. Colonic diverticulosis without diverticulitis. Electronically Signed: By: Thornell Sartorius M.D. On: 12/26/2022 22:53   DG Chest Port 1 View  Result Date: 12/26/2022 CLINICAL DATA:  Hypertension.  Pain. EXAM: PORTABLE CHEST 1 VIEW COMPARISON:  05/06/2020 FINDINGS: Stable heart size and mediastinal contours. Aortic atherosclerosis. Subsegmental opacity in the lung bases. No pleural effusion or pneumothorax. Normal pulmonary vasculature. Slight scoliotic curvature of the spine. No  acute osseous findings. IMPRESSION: Subsegmental opacity in the lung bases, likely atelectasis. Electronically Signed   By: Narda Rutherford M.D.   On: 12/26/2022 22:08    EKG: Independently reviewed. See above  Assessment/Plan Acute Pulmonary Embolism  -admit to cardiac tele  - continue with eliquis -f/u with echo /dopplers -consider heme consult re length of therapy      New DX of  hypertension -per patient was told she had white coat hypertension ,however her blood pressure was never this elevated  -start metoprolol low dose  -start hydralazine prn -monitor closely for lows  Anemia -base around 12 ,however prior 14- 2 years ago  -will continue to monitor on Eliquis    Anxiety -resume home anxiolytic    Palpitations - noted pvc/pac on EKG -monitor on tele  - may need holter to evaluate burden    HLD  -not able to tolerate statin continue with zetia  DVT prophylaxis: OAC Code Status: full/ as discussed per patient wishes in event of cardiac arrest  Family Communication:  none at beside Disposition Plan: patient  expected to be admitted greater than 2 midnights  Consults called: n/a Admission status: cardiac tele   Lurline Del MD [facility]   If 7PM-7AM, please contact night-coverage [email or web address] Password [facility]  12/27/2022, 2:56 PM

## 2022-12-27 NOTE — Plan of Care (Signed)
  Problem: Activity: Goal: Risk for activity intolerance will decrease Outcome: Progressing   Problem: Pain Management: Goal: General experience of comfort will improve Outcome: Progressing   Problem: Safety: Goal: Ability to remain free from injury will improve Outcome: Progressing

## 2022-12-27 NOTE — ED Provider Notes (Signed)
Emergency Medicine Observation Re-evaluation Note  AMIIRA LIER is a 77 y.o. female, seen on rounds today.  Pt initially presented to the ED for complaints of Hypertension and Back Pain Currently, the patient is awake, complaining of shoulder pain, otherwise no new complaints.  Physical Exam  BP (!) 143/72   Pulse 64   Temp 98.4 F (36.9 C) (Oral)   Resp (!) 26   Ht 5\' 4"  (1.626 m)   Wt 68 kg   SpO2 96%   BMI 25.75 kg/m  Physical Exam General: Awake and alert, no acute distress Cardiac: Regular rate Lungs: No increased WOB Psych: calm, cooperative  ED Course / MDM  EKG:EKG Interpretation Date/Time:  Wednesday December 26 2022 19:51:36 EST Ventricular Rate:  74 PR Interval:  118 QRS Duration:  82 QT Interval:  370 QTC Calculation: 410 R Axis:   43  Text Interpretation: Sinus rhythm with Blocked Premature atrial complexes with Premature supraventricular complexes Nonspecific ST and T wave abnormality When compared with ECG of 03-Aug-2022 08:50, Premature ventricular complexes are no longer Present Premature supraventricular complexes are now Present Criteria for Septal infarct are no longer Present Confirmed by Gwyneth Sprout ([id number]) on 12/26/2022 8:01:59 PM  I have reviewed the labs performed to date as well as medications administered while in observation.  Recent changes in the last 24 hours include found to have new PE, started on Eliquis. Was hypertensive to 200's overnight and treated with labetalol. BP has been controlled for several hours but starting to increase. May be secondary to pain and will continue to be monitored.  Plan  Current plan is for admission for pain and BP control in the setting of new PE.    Rexford Maus, DO 12/27/22 [phone]

## 2022-12-28 ENCOUNTER — Other Ambulatory Visit (HOSPITAL_COMMUNITY): Payer: Self-pay

## 2022-12-28 ENCOUNTER — Telehealth (HOSPITAL_COMMUNITY): Payer: Self-pay | Admitting: Pharmacy Technician

## 2022-12-28 ENCOUNTER — Observation Stay (HOSPITAL_BASED_OUTPATIENT_CLINIC_OR_DEPARTMENT_OTHER): Payer: Medicare Other

## 2022-12-28 ENCOUNTER — Telehealth: Payer: Self-pay | Admitting: Cardiology

## 2022-12-28 DIAGNOSIS — M545 Low back pain, unspecified: Secondary | ICD-10-CM | POA: Diagnosis not present

## 2022-12-28 DIAGNOSIS — M79606 Pain in leg, unspecified: Secondary | ICD-10-CM | POA: Diagnosis not present

## 2022-12-28 DIAGNOSIS — I10 Essential (primary) hypertension: Secondary | ICD-10-CM | POA: Diagnosis not present

## 2022-12-28 DIAGNOSIS — I2699 Other pulmonary embolism without acute cor pulmonale: Secondary | ICD-10-CM

## 2022-12-28 LAB — ECHOCARDIOGRAM COMPLETE
Area-P 1/2: 3.79 cm2
Height: 63.5 in
S' Lateral: 2.9 cm
Weight: 2596.14 [oz_av]

## 2022-12-28 MED ORDER — AMLODIPINE BESYLATE 5 MG PO TABS
5.0000 mg | ORAL_TABLET | Freq: Every day | ORAL | Status: DC
  Administered 2022-12-29: 5 mg via ORAL
  Filled 2022-12-28: qty 1

## 2022-12-28 MED ORDER — APIXABAN 5 MG PO TABS: 5.0000 mg | ORAL_TABLET | Freq: Two times a day (BID) | ORAL | Status: DC

## 2022-12-28 MED ORDER — APIXABAN 5 MG PO TABS: ORAL_TABLET | ORAL | 0 refills | Status: DC

## 2022-12-28 MED ORDER — APIXABAN 5 MG PO TABS
ORAL_TABLET | ORAL | 0 refills | Status: DC
  Filled 2022-12-28: qty 70, 28d supply, fill #0

## 2022-12-28 MED ORDER — ACETAMINOPHEN 325 MG PO TABS
650.0000 mg | ORAL_TABLET | Freq: Four times a day (QID) | ORAL | Status: DC
  Administered 2022-12-28 – 2022-12-29 (×2): 650 mg via ORAL
  Filled 2022-12-28 (×2): qty 2

## 2022-12-28 MED ORDER — APIXABAN 5 MG PO TABS: ORAL_TABLET | ORAL | Status: DC

## 2022-12-28 NOTE — Telephone Encounter (Signed)
Received a call from hospital about pt (was not able to get her name). Pt was started on Eliquis in the hospital however she has not met her deductible and because she is on Medicare she stated that she will not be able to go forward with patient assistance. She wants to know what Dr. Jens Som would like to do and if she can get samples in the meantime. Please advise.   There is also another phone note from [facility] pharmacy about this issue from today as well.

## 2022-12-28 NOTE — Plan of Care (Signed)
Problem: Clinical Measurements: Goal: Ability to maintain clinical measurements within normal limits will improve Outcome: Progressing Goal: Diagnostic test results will improve Outcome: Progressing   Problem: Activity: Goal: Risk for activity intolerance will decrease Outcome: Progressing   Problem: Nutrition: Goal: Adequate nutrition will be maintained Outcome: Progressing

## 2022-12-28 NOTE — Plan of Care (Signed)
  Problem: Clinical Measurements: Goal: Ability to maintain clinical measurements within normal limits will improve Outcome: Progressing   Problem: Clinical Measurements: Goal: Ability to maintain clinical measurements within normal limits will improve Outcome: Progressing   Problem: Clinical Measurements: Goal: Will remain free from infection Outcome: Progressing   Problem: Clinical Measurements: Goal: Diagnostic test results will improve Outcome: Progressing   Problem: Clinical Measurements: Goal: Respiratory complications will improve Outcome: Progressing   Problem: Clinical Measurements: Goal: Respiratory complications will improve Outcome: Progressing   Problem: Clinical Measurements: Goal: Cardiovascular complication will be avoided Outcome: Progressing   Problem: Pain Management: Goal: General experience of comfort will improve Outcome: Progressing   Problem: Elimination: Goal: Will not experience complications related to urinary retention Outcome: Progressing

## 2022-12-28 NOTE — Progress Notes (Signed)
Mobility Specialist Progress Note:   12/28/22 1100  Mobility  Activity Ambulated independently in hallway  Level of Assistance Independent  Assistive Device None  Distance Ambulated (ft) 500 ft  Activity Response Tolerated well  Mobility Referral Yes  $Mobility charge 1 Mobility  Mobility Specialist Start Time (ACUTE ONLY) 1100  Mobility Specialist Stop Time (ACUTE ONLY) 1108  Mobility Specialist Time Calculation (min) (ACUTE ONLY) 8 min   During Mobility: HR 77bpm  Pt received ambulating in hallway with husband after test this am. Ambulating at an independent level. No c/o. Pt left with all needs met.    Addison Lank Mobility Specialist Please contact via SecureChat or  Rehab office at [phone]

## 2022-12-28 NOTE — Progress Notes (Signed)
PT Cancellation Note  Patient Details Name: Dawn Payne MRN: [id number] DOB: [birth year]   Cancelled Treatment:    Reason Eval/Treat Not Completed: PT screened, no needs identified, will sign off. Pt is ambulating independently in the room and hallway. Pt has ambulated with mobility specialist earlier in the day and has no concerns about her mobility, denies and DOE. Pt does not appear to have PT needs at this time. Acute PT signing off.   Arlyss Gandy 12/28/2022, 4:59 PM

## 2022-12-28 NOTE — Telephone Encounter (Signed)
Patient Product/process development scientist completed.    The patient is insured through [employer]. Patient has Medicare and is not eligible for a copay card, but may be able to apply for patient assistance, if available.    Ran test claim for Eliquis 5 mg and the current 30 day co-pay is $541.69 due to a deductible.   This test claim was processed through [facility]- copay amounts may vary at other pharmacies due to pharmacy/plan contracts, or as the patient moves through the different stages of their insurance plan.     Roland Earl, CPHT Pharmacy Technician III Certified Patient Advocate [facility] Patient Advocate Team Direct Number: [phone]  Fax: [phone]

## 2022-12-28 NOTE — Telephone Encounter (Signed)
Spoke with staff member at [facility], aware samples placed at the front desk for pick up. They are giving her 30 days worth of medications at discharge.

## 2022-12-28 NOTE — Progress Notes (Signed)
BLE venous duplex has been completed.   Results can be found under chart review under CV PROC. 12/28/2022 10:25 AM Talissa Apple RVT, RDMS

## 2022-12-28 NOTE — Assessment & Plan Note (Signed)
Back pain has improved.  Continue with acetaminophen and  methocarbamol for muscle relaxant.  Hold on corticosteroids for now.  Follow up as outpatient.

## 2022-12-28 NOTE — TOC Initial Note (Addendum)
Transition of Care [facility]) - Initial/Assessment Note    Patient Details  Name: Dawn Payne MRN: [id number] Date of Birth: [birth year]  [facility]) CM/SW Contact:    Leone Haven, RN Phone Number: 12/28/2022, 3:14 PM  Clinical Narrative:                 From home with spouse, has PCP and insurance on file, states has no HH services in place at this time or DME at home.  States spouse will transport her home at [employer] and he is support system, states [facility] [address].  Pta self ambulatory . Has PE, will be on eliquis, copay is 541.69 due to deductible not being met.  Will see if she can apply for patient ast.  She will not qualify for patient ast since she has not even met her deductible yet.   Dr. Jens Som office called from [facility] stating they have samples for patient , she can walk in and pick them up when she can.   Expected Discharge Plan: Home/Self Care Barriers to Discharge: Continued Medical Work up   Patient Goals and CMS Choice Patient states their goals for this hospitalization and ongoing recovery are:: return home          Expected Discharge Plan and Services In-house Referral: NA Discharge Planning Services: CM Consult Post Acute Care Choice: NA Living arrangements for the past 2 months: Single Family Home                 DME Arranged: N/A DME Agency: NA       HH Arranged: NA          Prior Living Arrangements/Services Living arrangements for the past 2 months: Single Family Home Lives with:: Spouse Patient language and need for interpreter reviewed:: Yes Do you feel safe going back to the place where you live?: Yes      Need for Family Participation in Patient Care: Yes (Comment) Care giver support system in place?: Yes (comment)   Criminal Activity/Legal Involvement Pertinent to Current Situation/Hospitalization: No - Comment as needed  Activities of Daily Living   ADL Screening  (condition at time of admission) Independently performs ADLs?: Yes (appropriate for developmental age) Is the patient deaf or have difficulty hearing?: No Does the patient have difficulty seeing, even when wearing glasses/contacts?: No Does the patient have difficulty concentrating, remembering, or making decisions?: No  Permission Sought/Granted Permission sought to share information with : Case Manager Permission granted to share information with : Yes, Verbal Permission Granted              Emotional Assessment Appearance:: Appears stated age Attitude/Demeanor/Rapport: Engaged Affect (typically observed): Appropriate Orientation: : Oriented to Place, Oriented to Self, Oriented to  Time, Oriented to Situation Alcohol / Substance Use: Not Applicable Psych Involvement: No (comment)  Admission diagnosis:  Acute pulmonary embolism (HCC) [I26.99] Uncontrolled hypertension [I10] Acute pain of left shoulder [M25.512] Other acute pulmonary embolism without acute cor pulmonale (HCC) [I26.99] Patient Active Problem List   Diagnosis Date Noted   Essential hypertension 12/28/2022   Lower back pain 12/28/2022   Acute pulmonary embolism (HCC) 12/27/2022   ESR raised 09/29/2012   PALPITATIONS 02/03/2010   PCP:  Daisy Floro, MD Pharmacy:   [id number] [place], [place] - [address] [place] [address] Phone: [phone] Fax: [phone]  [facility] [address] [place] [address]  Phone: [phone] Fax: [phone]     Social Determinants of Health (SDOH) Social History: SDOH Screenings   Food Insecurity: No Food Insecurity (12/27/2022)  Housing: Low Risk  (12/27/2022)  Transportation Needs: No Transportation Needs (12/27/2022)  Utilities: Not At Risk (12/27/2022)  Social Connections: Unknown (06/20/2021)   Received from [facility]  Tobacco Use: Low Risk  (12/26/2022)   SDOH  Interventions:     Readmission Risk Interventions     No data to display

## 2022-12-28 NOTE — Care Management Obs Status (Signed)
MEDICARE OBSERVATION STATUS NOTIFICATION   Patient Details  Name: Dawn Payne MRN: [id number] Date of Birth: 06-22-1945   Medicare Observation Status Notification Given:  Yes    Leone Haven, RN 12/28/2022, 3:49 PM

## 2022-12-28 NOTE — Discharge Instructions (Addendum)
Information on my medicine - ELIQUIS (apixaban)  This medication education was reviewed with me or my healthcare representative as part of my discharge preparation.     Why was Eliquis prescribed for you? Eliquis was prescribed to treat blood clots that may have been found in the veins of your legs (deep vein thrombosis) or in your lungs (pulmonary embolism) and to reduce the risk of them occurring again.  What do You need to know about Eliquis ? The starting dose is 10 mg (two 5 mg tablets) taken TWICE daily for the FIRST SEVEN (7) DAYS, then on 01/03/23  the dose is reduced to ONE 5 mg tablet taken TWICE daily.  Eliquis may be taken with or without food.   Try to take the dose about the same time in the morning and in the evening. If you have difficulty swallowing the tablet whole please discuss with your pharmacist how to take the medication safely.  Take Eliquis exactly as prescribed and DO NOT stop taking Eliquis without talking to the doctor who prescribed the medication.  Stopping may increase your risk of developing a new blood clot.  Refill your prescription before you run out.  After discharge, you should have regular check-up appointments with your healthcare provider that is prescribing your Eliquis.    What do you do if you miss a dose? If a dose of ELIQUIS is not taken at the scheduled time, take it as soon as possible on the same day and twice-daily administration should be resumed. The dose should not be doubled to make up for a missed dose.  Important Safety Information A possible side effect of Eliquis is bleeding. You should call your healthcare provider right away if you experience any of the following: Bleeding from an injury or your nose that does not stop. Unusual colored urine (red or dark brown) or unusual colored stools (red or black). Unusual bruising for unknown reasons. A serious fall or if you hit your head (even if there is no bleeding).  Some  medicines may interact with Eliquis and might increase your risk of bleeding or clotting while on Eliquis. To help avoid this, consult your healthcare provider or pharmacist prior to using any new prescription or non-prescription medications, including herbals, vitamins, non-steroidal anti-inflammatory drugs (NSAIDs) and supplements.  This website has more information on Eliquis (apixaban): http://www.eliquis.com/eliquis/home  ============================  Pulmonary Embolism    A pulmonary embolism (PE) is a sudden blockage or decrease of blood flow in one or both lungs. Most blockages come from a blood clot that forms in the vein of a lower leg, thigh, or arm (deep vein thrombosis, DVT) and travels to the lungs. A clot is blood that has thickened into a gel or solid. PE is a dangerous and life-threatening condition that needs to be treated right away.  What are the causes? This condition is usually caused by a blood clot that forms in a vein and moves to the lungs. In rare cases, it may be caused by air, fat, part of a tumor, or other tissue that moves through the veins and into the lungs.  What increases the risk? The following factors may make you more likely to develop this condition: Experiencing a traumatic injury, such as breaking a hip or leg. Having: A spinal cord injury. Orthopedic surgery, especially hip or knee replacement. Any major surgery. A stroke. DVT. Blood clots or blood clotting disease. Long-term (chronic) lung or heart disease. Cancer treated with chemotherapy. A central venous catheter.  Taking medicines that contain estrogen. These include birth control pills and hormone replacement therapy. Being: Pregnant. In the period of time after your baby is delivered (postpartum). Older than age 55. Overweight. A smoker, especially if you have other risks.  What are the signs or symptoms? Symptoms of this condition usually start suddenly and include: Shortness of  breath during activity or at rest. Coughing, coughing up blood, or coughing up blood-tinged mucus. Chest pain that is often worse with deep breaths. Rapid or irregular heartbeat. Feeling light-headed or dizzy. Fainting. Feeling anxious. Fever. Sweating. Pain and swelling in a leg. This is a symptom of DVT, which can lead to PE. How is this diagnosed? This condition may be diagnosed based on: Your medical history. A physical exam. Blood tests. CT pulmonary angiogram. This test checks blood flow in and around your lungs. Ventilation-perfusion scan, also called a lung VQ scan. This test measures air flow and blood flow to the lungs. An ultrasound of the legs.  How is this treated? Treatment for this condition depends on many factors, such as the cause of your PE, your risk for bleeding or developing more clots, and other medical conditions you have. Treatment aims to remove, dissolve, or stop blood clots from forming or growing larger. Treatment may include: Medicines, such as: Blood thinning medicines (anticoagulants) to stop clots from forming. Medicines that dissolve clots (thrombolytics). Procedures, such as: Using a flexible tube to remove a blood clot (embolectomy) or to deliver medicine to destroy it (catheter-directed thrombolysis). Inserting a filter into a large vein that carries blood to the heart (inferior vena cava). This filter (vena cava filter) catches blood clots before they reach the lungs. Surgery to remove the clot (surgical embolectomy). This is rare. You may need a combination of immediate, long-term (up to 3 months after diagnosis), and extended (more than 3 months after diagnosis) treatments. Your treatment may continue for several months (maintenance therapy). You and your health care provider will work together to choose the treatment program that is best for you.  Follow these instructions at home: Medicines Take over-the-counter and prescription medicines only  as told by your health care provider. If you are taking an anticoagulant medicine: Take the medicine every day at the same time each day. Understand what foods and drugs interact with your medicine. Understand the side effects of this medicine, including excessive bruising or bleeding. Ask your health care provider or pharmacist about other side effects.  General instructions Wear a medical alert bracelet or carry a medical alert card that says you have had a PE and lists what medicines you take. Ask your health care provider when you may return to your normal activities. Avoid sitting or lying for a long time without moving. Maintain a healthy weight. Ask your health care provider what weight is healthy for you. Do not use any products that contain nicotine or tobacco, such as cigarettes, e-cigarettes, and chewing tobacco. If you need help quitting, ask your health care provider. Talk with your health care provider about any travel plans. It is important to make sure that you are still able to take your medicine while on trips. Keep all follow-up visits as told by your health care provider. This is important.  Contact a health care provider if: You missed a dose of your blood thinner medicine.  Get help right away if: You have: New or increased pain, swelling, warmth, or redness in an arm or leg. Numbness or tingling in an arm or leg. Shortness  of breath during activity or at rest. A fever. Chest pain. A rapid or irregular heartbeat. A severe headache. Vision changes. A serious fall or accident, or you hit your head. Stomach (abdominal) pain. Blood in your vomit, stool, or urine. A cut that will not stop bleeding. You cough up blood. You feel light-headed or dizzy. You cannot move your arms or legs. You are confused or have memory loss.  These symptoms may represent a serious problem that is an emergency. Do not wait to see if the symptoms will go away. Get medical help right  away. Call your local emergency services (911 in the [place] Do not drive yourself to the hospital. Summary A pulmonary embolism (PE) is a sudden blockage or decrease of blood flow in one or both lungs. PE is a dangerous and life-threatening condition that needs to be treated right away. Treatments for this condition usually include medicines to thin your blood (anticoagulants) or medicines to break apart blood clots (thrombolytics). If you are given blood thinners, it is important to take the medicine every day at the same time each day. Understand what foods and drugs interact with any medicines that you are taking. If you have signs of PE or DVT, call your local emergency services (911 in the [place] This information is not intended to replace advice given to you by your health care provider. Make sure you discuss any questions you have with your health care provider. Document Revised: 10/30/2017 Document Reviewed: 10/30/2017 Elsevier Patient Education  2020 [employer].

## 2022-12-28 NOTE — Hospital Course (Signed)
Dawn Payne was admitted to the hospital with the working diagnosis of acute pulmonary embolism.   77 yo female with the past medical history of anemia, dyslipidemia and anxiety who presented with uncontrolled blood pressure associated with back pain. At home patient has been lifting heavy boxes, developing persistent back pain. She was initially evaluated at orthopedics, and found elevated blood pressure, then she was referred to the ED. On her initial physical examination her blood pressure was 200/80, HR 76, RR 16 and 02 saturation 100%. Lungs with no wheezing or rales, heart with S1 and S2 present and regular, abdomen soft with no distention and no lower extremity edema.   Na 139, K 3,9 Cl 102 bicarbonate 29, glucose 105 bun 17 cr 0,79 High sensitive troponin 9 and 9  Wbc 9,7 hgb 12,2 plt 302   Chest radiograph with left rotation, mild atelectasis at bases with no infiltrates or effusions.   EKG 74 bpm, normal axis, normal intervals, sinus rhythm with frequent PVC, with no significant ST segment or T wave changes.   CT chest with small segmental and subsegmental pulmonary emboli in the right upper and lower lobes. No evidence of right heart strain.  Aortic atherosclerosis without evidence of aneurysm or dissection.

## 2022-12-28 NOTE — Progress Notes (Signed)
  Progress Note   Dawn Payne: Dawn Payne DGU:[id number] DOB: Jul 28, 1945 DOA: 12/26/2022     0 DOS: the Dawn Payne was seen and examined on 12/28/2022   Brief hospital course: Dawn Payne was admitted to the hospital with the working diagnosis of acute pulmonary embolism.   77 yo female with the past medical history of anemia, dyslipidemia and anxiety who presented with uncontrolled blood pressure associated with back pain. At home Dawn Payne has been lifting heavy boxes, developing persistent back pain. She was initially evaluated at orthopedics, and found elevated blood pressure, then she was referred to the ED. On her initial physical examination her blood pressure was 200/80, HR 76, RR 16 and 02 saturation 100%. Lungs with no wheezing or rales, heart with S1 and S2 present and regular, abdomen soft with no distention and no lower extremity edema.   CT chest with small segmental and subsegmental pulmonary emboli in the right upper and lower lobes. No evidence of right heart strain.  Aortic atherosclerosis without evidence of aneurysm or dissection.    Assessment and Plan: * Acute pulmonary embolism (HCC) Follow up on echocardiogram.  Ultrasound lower extremities negative for deep vein thrombosis. Dawn Payne has been sedentary lately, accompanying her husband who had multiple surgeries.     Essential hypertension Blood pressure has improved, will transition from metoprolol to amlodipine.  Will need follow up blood pressure monitoring as outpatient for further dose adjustment.  Continue pain control.   Lower back pain Back pain has improved, will add scheduled acetaminophen and will continue methocarbamol for muscle relaxant.  Hold on corticosteroids for now.         Subjective: Dawn Payne with improvement in back pain, has no chest pain or dyspnea. Positive anxiety related to new diagnoses.   Physical Exam: Vitals:   12/28/22 0510 12/28/22 0745 12/28/22 0902 12/28/22 1135  BP: (!) 180/62  (!) 176/94 (!) 180/159 (!) 185/81  Pulse: 63 65 78 68  Resp: 13 20 20    Temp: 97.9 F (36.6 C) 98.1 F (36.7 C)    TempSrc: Oral Oral    SpO2: 95% 95% 97% 97%  Weight:      Height:       Neurology awake and alert ENT with no pallor Cardiovascular with S1 and S2 present and regular with no gallops, rubs or murmurs No JVD No lower extremity edema Respiratory with no rales or wheezing, no rhonchi Abdomen with no distention  Data Reviewed:    Family Communication: I spoke with Dawn Payne's husband at the bedside, we talked in detail about Dawn Payne's condition, plan of care and prognosis and all questions were addressed.   Disposition: Status is: Observation The Dawn Payne remains OBS appropriate and will d/c before 2 midnights.  Planned Discharge Destination: Home    Author: Coralie Keens, MD 12/28/2022 2:05 PM  For on call review [email or web address].

## 2022-12-28 NOTE — Assessment & Plan Note (Signed)
Patient with anxiety and back pain, I suspect she has a component of white coat hypertension, but can not completely rule out hypertension. Note mild LVH on echocardiogram and mild left atrium enlargement.   Plan to continue amlodipine 5 mg daily and follow up blood pressure at home as outpatient.  If blood pressure less than 120 mmHg systolic instructed to hold on amlodipine.

## 2022-12-28 NOTE — Assessment & Plan Note (Signed)
Patient was placed on anticoagulation with apixaban with good toleration.   Further work up with echocardiogram showed preserved LV systolic function with EF 55 to 60%, mild LVH, RV systolic function preserved, LA with mild dilatation, RA normal size, no significant valvular disease.   Ultrasound lower extremities negative for deep vein thrombosis. Patient has been sedentary lately, accompanying her husband who had multiple surgeries.   Plan to continue apixaban as outpatient, will need 3 to 6 months of therapy for provoked deep vein thrombosis.

## 2022-12-29 ENCOUNTER — Other Ambulatory Visit (HOSPITAL_COMMUNITY): Payer: Self-pay

## 2022-12-29 DIAGNOSIS — I10 Essential (primary) hypertension: Secondary | ICD-10-CM | POA: Diagnosis not present

## 2022-12-29 DIAGNOSIS — I2699 Other pulmonary embolism without acute cor pulmonale: Secondary | ICD-10-CM | POA: Diagnosis not present

## 2022-12-29 DIAGNOSIS — M545 Low back pain, unspecified: Secondary | ICD-10-CM | POA: Diagnosis not present

## 2022-12-29 LAB — BASIC METABOLIC PANEL
Anion gap: 6 (ref 5–15)
BUN: 13 mg/dL (ref 8–23)
CO2: 29 mmol/L (ref 22–32)
Calcium: 9.4 mg/dL (ref 8.9–10.3)
Chloride: 103 mmol/L (ref 98–111)
Creatinine, Ser: 0.95 mg/dL (ref 0.44–1.00)
GFR, Estimated: >60 mL/min (ref >60)
Glucose, Bld: 116 mg/dL — ABNORMAL HIGH (ref 70–99)
Potassium: 4.4 mmol/L (ref 3.5–5.1)
Sodium: 138 mmol/L (ref 135–145)

## 2022-12-29 LAB — CBC
HCT: 37.4 % (ref 36.0–46.0)
Hemoglobin: 12 g/dL (ref 12.0–15.0)
MCH: 29.9 pg (ref 26.0–34.0)
MCHC: 32.1 g/dL (ref 30.0–36.0)
MCV: 93.3 fL (ref 80.0–100.0)
Platelets: 295 10*3/uL (ref 150–400)
RBC: 4.01 MIL/uL (ref 3.87–5.11)
RDW: 12.2 % (ref 11.5–15.5)
WBC: 7.4 10*3/uL (ref 4.0–10.5)
nRBC: 0 % (ref 0.0–0.2)

## 2022-12-29 MED ORDER — AMLODIPINE BESYLATE 5 MG PO TABS
5.0000 mg | ORAL_TABLET | Freq: Every day | ORAL | 0 refills | Status: AC
  Filled 2022-12-29: qty 30, 30d supply, fill #0

## 2022-12-29 MED ORDER — ACETAMINOPHEN 325 MG PO TABS: 650.0000 mg | ORAL_TABLET | Freq: Four times a day (QID) | ORAL | Status: AC | PRN

## 2022-12-29 MED ORDER — METHOCARBAMOL 500 MG PO TABS
500.0000 mg | ORAL_TABLET | Freq: Four times a day (QID) | ORAL | 0 refills | Status: AC | PRN
  Filled 2022-12-29: qty 20, 5d supply, fill #0

## 2022-12-29 MED ORDER — APIXABAN 5 MG PO TABS: ORAL_TABLET | ORAL | Status: DC

## 2022-12-29 MED ORDER — APIXABAN 5 MG PO TABS
ORAL_TABLET | ORAL | 0 refills | Status: DC
  Filled 2022-12-29: qty 72, 30d supply, fill #0

## 2022-12-29 NOTE — Evaluation (Signed)
Occupational Therapy Evaluation Patient Details Name: Dawn Payne MRN: [id number] DOB: May 09, 1945 Today's Date: 12/29/2022   History of Present Illness 77 y.o. female presents to [facility] hospital on 12/26/2022 with uncontrolled BP and back pain. CT chest with RUL and RLL. PMH includes anemia, HLD, anxiety.   Clinical Impression   At baseline, pt is Independent will all ADLs, IADLs, and functional mobility without an AD. Pt drives and works as a Dietitian. Pt now presents near baseline PLOF and demonstrates ability to complete ADLs and functional mobility/transfers Independent to Supervision for increased safety with no physical assist needed. Pt and her husband have already made a plan for tracking pt's BP and managing new medications in the home and pt demonstrates good safety awareness and insight into deficits. No further benefit from acute skilled OT services at this time and no equipment needs identified. No post acute OT follow up is indicated at this time. OT is signing off.       If plan is discharge home, recommend the following: A little help with bathing/dressing/bathroom;Assistance with cooking/housework (PRN assist)    Functional Status Assessment  Patient has had a recent decline in their functional status and demonstrates the ability to make significant improvements in function in a reasonable and predictable amount of time.  Equipment Recommendations  None recommended by OT (Pt already has needed equipment)    Recommendations for Other Services       Precautions / Restrictions Precautions Precautions: Fall Restrictions Weight Bearing Restrictions: No      Mobility Bed Mobility Overal bed mobility: Independent                  Transfers Overall transfer level: Modified independent Equipment used: None               General transfer comment: Occasionally requiring increased time/effort due to pain      Balance Overall balance assessment:  Independent                                         ADL either performed or assessed with clinical judgement   ADL Overall ADL's : Needs assistance/impaired;Modified independent Eating/Feeding: Independent;Sitting   Grooming: Modified independent;Standing   Upper Body Bathing: Supervision/ safety;Sitting Upper Body Bathing Details (indicate cue type and reason): Supervision recommended for safety due to pain and pt fear of falling. No physical assist needed. Lower Body Bathing: Supervison/ safety;Sit to/from stand Lower Body Bathing Details (indicate cue type and reason): Supervision recommended for safety due to pain and pt fear of falling. No physical assist needed. Upper Body Dressing : Independent;Sitting   Lower Body Dressing: Modified independent;Sitting/lateral leans;Sit to/from stand   Toilet Transfer: Modified Independent;Ambulation;Regular Toilet (without an AD)   Toileting- Clothing Manipulation and Hygiene: Modified independent;Sit to/from stand       Functional mobility during ADLs: Modified independent (without an AD)       Vision Ability to See in Adequate Light: 0 Adequate Patient Visual Report: No change from baseline       Perception         Praxis         Pertinent Vitals/Pain Pain Assessment Pain Assessment: 0-10 Pain Score: 3  Pain Location: back Pain Descriptors / Indicators: Shooting Pain Intervention(s): Limited activity within patient's tolerance, Monitored during session     Extremity/Trunk Assessment Upper Extremity Assessment Upper Extremity Assessment: Overall [facility]  for tasks assessed   Lower Extremity Assessment Lower Extremity Assessment: Defer to PT evaluation   Cervical / Trunk Assessment Cervical / Trunk Assessment: Normal   Communication Communication Communication: No apparent difficulties   Cognition Arousal: Alert Behavior During Therapy: Anxious, WFL for tasks assessed/performed (largely [facility] but  anxious regarding possibility of a fall in the future and getting used to taking new medication.) Overall Cognitive Status: Within Functional Limits for tasks assessed                                 General Comments: AAOx4. Good safety awareness and insight into deficits. Pt reports anxiety related to managing new medications and BP management but is able to independently report medication instructions, already has a plan for tracking BPs, and has already discussed use of medication planner with her husband who also uses a medication planner.     General Comments  Pt's husband present throughout session.    Exercises     Shoulder Instructions      Home Living Family/patient expects to be discharged to:: Private residence Living Arrangements: Spouse/significant other Available Help at Discharge: Family;Available 24 hours/day Type of Home: House Home Access: Stairs to enter [employer] of Steps: 3 Entrance Stairs-Rails: Right (at garage door; both at front door; pt mainly uses garage door) Home Layout: Two level;Able to live on main level with bedroom/bathroom Alternate Level Stairs-Number of Steps: flight Alternate Level Stairs-Rails: Can reach both;Right;Left Bathroom Shower/Tub: Tub/shower unit   Bathroom Toilet: Handicapped height Bathroom Accessibility: Yes How Accessible: Accessible via walker Home Equipment: Rolling Walker (2 wheels);Shower seat          Prior Functioning/Environment Prior Level of Function : Independent/Modified Independent;Driving;Working/employed             Mobility Comments: Independent without an AD ADLs Comments: Independent with ADLs, IADLs, drives, and works as a Barrister's clerk Problem List:        OT Treatment/Interventions:      OT Goals(Current goals can be found in the care plan section) Acute Rehab OT Goals Patient Stated Goal: to return home, have less pain, and successfully manage new  medications  OT Frequency:      Co-evaluation              AM-PAC OT "6 Clicks" Daily Activity     Outcome Measure Help from another person eating meals?: None Help from another person taking care of personal grooming?: None Help from another person toileting, which includes using toliet, bedpan, or urinal?: None Help from another person bathing (including washing, rinsing, drying)?: A Little (Supervision only, no physical assist needed) Help from another person to put on and taking off regular upper body clothing?: None Help from another person to put on and taking off regular lower body clothing?: None 6 Click Score: 23   End of Session Nurse Communication: Mobility status;Other (comment) (OT signing off. Pt may benefit from reviewing medication instructions prior to discharge.)  Activity Tolerance: Patient tolerated treatment well Patient left: in bed;with call bell/phone within reach;with family/visitor present  OT Visit Diagnosis: Pain                Time: 9629-5284 OT Time Calculation (min): 13 min Charges:  OT General Charges $OT Visit: 1 Visit OT Evaluation $OT Eval Low Complexity: 1 Low  [address] OTR/L, [place] Acute Rehab [phone]  Dawn Payne 12/29/2022, 12:29 PM

## 2022-12-29 NOTE — Discharge Summary (Addendum)
Physician Discharge Summary   Patient: Dawn Payne MRN: [id number] DOB: October 07, 1945  Admit date:     12/26/2022  Discharge date: 12/29/22  Discharge Physician: York Ram Miquan Tandon   PCP: Daisy Floro, MD   Recommendations at discharge:    Patient has been placed on apixaban for anticoagulation, will need 3 to 6 months of therapy for a provoked deep vein thrombosis.  Added methocarbamol for muscle relaxant to take as needed. Started on amlodipine for blood pressure control, I suspect she has a component of white coat hypertension. Plan to follow up blood pressure at home and if systolic blood pressure 120 or lower to hold on amlodipine.   Discharge Diagnoses: Principal Problem:   Acute pulmonary embolism (HCC) Active Problems:   Essential hypertension   Lower back pain  Resolved Problems:   * No resolved hospital problems. [facility] Course: Dawn Payne was admitted to the hospital with the working diagnosis of acute pulmonary embolism.   77 yo female with the past medical history of anemia, dyslipidemia and anxiety who presented with uncontrolled blood pressure associated with back pain. At home patient has been lifting heavy boxes, developing persistent back pain. She was initially evaluated at orthopedics, and found elevated blood pressure, then she was referred to the ED. On her initial physical examination her blood pressure was 200/80, HR 76, RR 16 and 02 saturation 100%. Lungs with no wheezing or rales, heart with S1 and S2 present and regular, abdomen soft with no distention and no lower extremity edema.   Na 139, K 3,9 Cl 102 bicarbonate 29, glucose 105 bun 17 cr 0,79 High sensitive troponin 9 and 9  Wbc 9,7 hgb 12,2 plt 302   Chest radiograph with left rotation, mild atelectasis at bases with no infiltrates or effusions.   EKG 74 bpm, normal axis, normal intervals, sinus rhythm with frequent PVC, with no significant ST segment or T wave changes.   CT chest with  small segmental and subsegmental pulmonary emboli in the right upper and lower lobes. No evidence of right heart strain.  Aortic atherosclerosis without evidence of aneurysm or dissection.    Assessment and Plan: * Acute pulmonary embolism (HCC) Patient was placed on anticoagulation with apixaban with good toleration.   Further work up with echocardiogram showed preserved LV systolic function with EF 55 to 60%, mild LVH, RV systolic function preserved, LA with mild dilatation, RA normal size, no significant valvular disease.   Ultrasound lower extremities negative for deep vein thrombosis. Patient has been sedentary lately, accompanying her husband who had multiple surgeries.   Plan to continue apixaban as outpatient, will need 3 to 6 months of therapy for provoked deep vein thrombosis.   Essential hypertension Patient with anxiety and back pain, I suspect she has a component of white coat hypertension, but can not completely rule out hypertension. Note mild LVH on echocardiogram and mild left atrium enlargement.   Plan to continue amlodipine 5 mg daily and follow up blood pressure at home as outpatient.  If blood pressure less than 120 mmHg systolic instructed to hold on amlodipine.   Lower back pain Back pain has improved.  Continue with acetaminophen and  methocarbamol for muscle relaxant.  Hold on corticosteroids for now.  Follow up as outpatient.         Consultants: none  Procedures performed: none   Disposition: Home Diet recommendation:  Discharge Diet Orders (From admission, onward)     Start     Ordered  12/29/22 0000  Diet - low sodium heart healthy        12/29/22 1132           Cardiac diet DISCHARGE MEDICATION: Allergies as of 12/29/2022       Reactions   Sulfonamide Derivatives Other (See Comments)   Pt states she turns bright red   Statins         Medication List     STOP taking these medications    predniSONE 5 MG (21) Tbpk  tablet Commonly known as: STERAPRED UNI-PAK 21 TAB       TAKE these medications    acetaminophen 325 MG tablet Commonly known as: TYLENOL Take 2 tablets (650 mg total) by mouth every 6 (six) hours as needed.   albuterol 108 (90 Base) MCG/ACT inhaler Commonly known as: VENTOLIN HFA as needed.   ALPRAZolam 0.5 MG tablet Commonly known as: XANAX Take 0.5 mg by mouth as needed for sleep. *take 3 times a week*   amLODipine 5 MG tablet Commonly known as: NORVASC Take 1 tablet (5 mg total) by mouth daily. Start taking on: December 30, 2022   apixaban 5 MG Tabs tablet Commonly known as: ELIQUIS 11/21- 11/28 morning: take 2 tablets (10mg ) by mouth TWICE daily 11/28 evening and on: decrease to 1 tablet (5mg ) by mouth TWICE daily Start taking on: January 02, 2023   ezetimibe 10 MG tablet Commonly known as: ZETIA Take 1 tablet (10 mg total) by mouth daily.   methocarbamol 500 MG tablet Commonly known as: ROBAXIN Take 1 tablet (500 mg total) by mouth every 6 (six) hours as needed for muscle spasms.   Shingrix injection Generic drug: Zoster Vaccine Adjuvanted Inject 0.5 mLs into the muscle once.        Discharge Exam: Filed Weights   12/26/22 1935 12/27/22 1256 12/29/22 0527  Weight: 68 kg 73.6 kg 72.7 kg   BP (!) 140/50 (BP Location: Left Arm)   Pulse 69   Temp 97.7 F (36.5 C) (Oral)   Resp 18   Ht 5' 3.5" (1.613 m)   Wt 72.7 kg   SpO2 94%   BMI 27.95 kg/m   Patient with no chest pain or dyspnea.   Neurology awake and alert ENT with mild pallor Cardiovascular with S1 and S2 present and no gallops, rubs or murmurs Respiratory with no rales or wheezing, no rhonchi Abdomen with no distention  No lower extremity edema   Condition at discharge: stable  The results of significant diagnostics from this hospitalization (including imaging, microbiology, ancillary and laboratory) are listed below for reference.   Imaging Studies: ECHOCARDIOGRAM  COMPLETE  Result Date: 12/28/2022    ECHOCARDIOGRAM REPORT   Patient Name:   Dawn Payne Date of Exam: 12/28/2022 Medical Rec #:  [id number]     Height:       63.5 in Accession #:    [id number]    Weight:       162.3 lb Date of Birth:  1945/02/25     BSA:          1.780 m Patient Age:    77 years      BP:           180/159 mmHg Patient Gender: F             HR:           61 bpm. Exam Location:  Inpatient Procedure: 2D Echo, Color Doppler and Cardiac Doppler Indications:    pulmonary  emboli  History:        Patient has prior history of Echocardiogram examinations, most                 recent 02/27/2016. Signs/Symptoms:Palipitations.  Sonographer:    Trellis Moment RDCS (AE, PE) Referring Phys: [id number] SARA-MAIZ A THOMAS IMPRESSIONS  1. Left ventricular ejection fraction, by estimation, is 55 to 60%. The left ventricle has normal function. The left ventricle has no regional wall motion abnormalities. There is mild concentric left ventricular hypertrophy. Left ventricular diastolic parameters were normal.  2. Right ventricular systolic function is normal. The right ventricular size is normal.  3. Left atrial size was mildly dilated.  4. The mitral valve is normal in structure. Trivial mitral valve regurgitation. No evidence of mitral stenosis.  5. The aortic valve is tricuspid. Aortic valve regurgitation is trivial.  6. The inferior vena cava is normal in size with greater than 50% respiratory variability, suggesting right atrial pressure of 3 mmHg. Comparison(s): No significant change from prior study. Conclusion(s)/Recommendation(s): Normal biventricular function without evidence of hemodynamically significant valvular heart disease. FINDINGS  Left Ventricle: Left ventricular ejection fraction, by estimation, is 55 to 60%. The left ventricle has normal function. The left ventricle has no regional wall motion abnormalities. The left ventricular internal cavity size was normal in size. There is  mild concentric  left ventricular hypertrophy. Left ventricular diastolic parameters were normal. Right Ventricle: The right ventricular size is normal. No increase in right ventricular wall thickness. Right ventricular systolic function is normal. Left Atrium: Left atrial size was mildly dilated. Right Atrium: Right atrial size was normal in size. Pericardium: There is no evidence of pericardial effusion. Mitral Valve: The mitral valve is normal in structure. Trivial mitral valve regurgitation. No evidence of mitral valve stenosis. Tricuspid Valve: The tricuspid valve is normal in structure. Tricuspid valve regurgitation is trivial. No evidence of tricuspid stenosis. Aortic Valve: The aortic valve is tricuspid. Aortic valve regurgitation is trivial. Pulmonic Valve: The pulmonic valve was not well visualized. Pulmonic valve regurgitation is not visualized. No evidence of pulmonic stenosis. Aorta: The aortic root, ascending aorta, aortic arch and descending aorta are all structurally normal, with no evidence of dilitation or obstruction. Venous: The inferior vena cava is normal in size with greater than 50% respiratory variability, suggesting right atrial pressure of 3 mmHg. IAS/Shunts: The atrial septum is grossly normal.  LEFT VENTRICLE PLAX 2D LVIDd:         3.90 cm   Diastology LVIDs:         2.90 cm   LV e' medial:    7.40 cm/s LV PW:         1.20 cm   LV E/e' medial:  8.9 LV IVS:        1.10 cm   LV e' lateral:   8.05 cm/s LVOT diam:     2.10 cm   LV E/e' lateral: 8.2 LV SV:         67 LV SV Index:   37 LVOT Area:     3.46 cm  RIGHT VENTRICLE RV S prime:     13.50 cm/s TAPSE (M-mode): 2.3 cm LEFT ATRIUM             Index        RIGHT ATRIUM           Index LA diam:        3.90 cm 2.19 cm/m   RA Area:     14.60  cm LA Vol (A2C):   54.1 ml 30.40 ml/m  RA Volume:   39.00 ml  21.91 ml/m LA Vol (A4C):   44.3 ml 24.89 ml/m LA Biplane Vol: 53.4 ml 30.01 ml/m  AORTIC VALVE LVOT Vmax:   82.50 cm/s LVOT Vmean:  59.400 cm/s LVOT  VTI:    0.192 m  AORTA Ao Root diam: 3.60 cm Ao Asc diam:  3.40 cm MITRAL VALVE MV Area (PHT): 3.79 cm    SHUNTS MV E velocity: 66.00 cm/s  Systemic VTI:  0.19 m MV A velocity: 68.60 cm/s  Systemic Diam: 2.10 cm MV E/A ratio:  0.96 Jodelle Red MD Electronically signed by Jodelle Red MD Signature Date/Time: 12/28/2022/3:51:17 PM    Final    VAS [place] LOWER EXTREMITY VENOUS (DVT)  Result Date: 12/28/2022  Lower Venous DVT Study Patient Name:  ATHENIA TOOR Buckle  Date of Exam:   12/28/2022 Medical Rec #: [id number]      Accession #:    [id number] Date of Birth: [birth year]      Patient Gender: F Patient Age:   74 years Exam Location:  [facility] Procedure:      VAS [place] LOWER EXTREMITY VENOUS (DVT) Referring Phys: Skip Mayer --------------------------------------------------------------------------------  Indications: Pulmonary embolism.  Comparison Study: No previous exams Performing Technologist: Jody Hill RVT, RDMS  Examination Guidelines: A complete evaluation includes B-mode imaging, spectral Doppler, color Doppler, and power Doppler as needed of all accessible portions of each vessel. Bilateral testing is considered an integral part of a complete examination. Limited examinations for reoccurring indications may be performed as noted. The reflux portion of the exam is performed with the patient in reverse Trendelenburg.  +---------+---------------+---------+-----------+----------+--------------+ RIGHT    CompressibilityPhasicitySpontaneityPropertiesThrombus Aging +---------+---------------+---------+-----------+----------+--------------+ CFV      Full           Yes      Yes                                 +---------+---------------+---------+-----------+----------+--------------+ SFJ      Full                                                        +---------+---------------+---------+-----------+----------+--------------+ FV Prox  Full           Yes      Yes                                  +---------+---------------+---------+-----------+----------+--------------+ FV Mid   Full           Yes      Yes                                 +---------+---------------+---------+-----------+----------+--------------+ FV DistalFull           Yes      Yes                                 +---------+---------------+---------+-----------+----------+--------------+ PFV      Full                                                        +---------+---------------+---------+-----------+----------+--------------+  POP      Full           Yes      Yes                                 +---------+---------------+---------+-----------+----------+--------------+ PTV      Full                                                        +---------+---------------+---------+-----------+----------+--------------+ PERO     Full                                                        +---------+---------------+---------+-----------+----------+--------------+   +---------+---------------+---------+-----------+----------+--------------+ LEFT     CompressibilityPhasicitySpontaneityPropertiesThrombus Aging +---------+---------------+---------+-----------+----------+--------------+ CFV      Full           Yes      Yes                                 +---------+---------------+---------+-----------+----------+--------------+ SFJ      Full                                                        +---------+---------------+---------+-----------+----------+--------------+ FV Prox  Full           Yes      Yes                                 +---------+---------------+---------+-----------+----------+--------------+ FV Mid   Full           Yes      Yes                                 +---------+---------------+---------+-----------+----------+--------------+ FV DistalFull           Yes      Yes                                  +---------+---------------+---------+-----------+----------+--------------+ PFV      Full                                                        +---------+---------------+---------+-----------+----------+--------------+ POP      Full           Yes      Yes                                 +---------+---------------+---------+-----------+----------+--------------+ PTV  Full                                                        +---------+---------------+---------+-----------+----------+--------------+ PERO     Full                                                        +---------+---------------+---------+-----------+----------+--------------+     Summary: BILATERAL: - No evidence of deep vein thrombosis seen in the lower extremities, bilaterally. -No evidence of popliteal cyst, bilaterally.   *See table(s) above for measurements and observations. Electronically signed by Gerarda Fraction on 12/28/2022 at 3:38:37 PM.    Final    CT Angio Chest/Abd/Pel for Dissection W and/or Wo Contrast  Addendum Date: 12/26/2022   ADDENDUM REPORT: 12/26/2022 22:57 ADDENDUM: Critical Value/emergent results were called by telephone at the time of interpretation on 12/26/2022 at 10:57 pm to provider [facility] , who verbally acknowledged these results. Electronically Signed   By: Thornell Sartorius M.D.   On: 12/26/2022 22:57   Result Date: 12/26/2022 CLINICAL DATA:  Sudden back pain, chest pain, and hypertension. EXAM: CT ANGIOGRAPHY CHEST, ABDOMEN AND PELVIS TECHNIQUE: Non-contrast CT of the chest was initially obtained. Multidetector CT imaging through the chest, abdomen and pelvis was performed using the standard protocol during bolus administration of intravenous contrast. Multiplanar reconstructed images and MIPs were obtained and reviewed to evaluate the vascular anatomy. RADIATION DOSE REDUCTION: This exam was performed according to the departmental dose-optimization program which includes  automated exposure control, adjustment of the mA and/or kV according to patient size and/or use of iterative reconstruction technique. CONTRAST:  OMNIPAQUE IOHEXOL 350 MG/ML SOLN COMPARISON:  02/20/2019. FINDINGS: CTA CHEST FINDINGS Cardiovascular: The heart is normal in size and there is no pericardial effusion. There is atherosclerotic calcification of the aorta without evidence of aneurysm or dissection. The pulmonary trunk is normal in caliber. There are small pulmonary emboli in the segmental and subsegmental pulmonary arteries in the right lower lobe and right upper lobe. No evidence of right heart strain is seen. Mediastinum/Nodes: No enlarged mediastinal, hilar, or axillary lymph nodes. Thyroid gland, trachea, and esophagus demonstrate no significant findings. Lungs/Pleura: Mild atelectasis or scarring is noted at the lung bases. No effusion or pneumothorax. Musculoskeletal: Degenerative changes are present in the thoracic spine. No acute osseous abnormality. Review of the MIP images confirms the above findings. CTA ABDOMEN AND PELVIS FINDINGS VASCULAR Aorta: Normal caliber aorta without aneurysm, dissection, vasculitis or significant stenosis. Aortic atherosclerosis. Celiac: Patent without evidence of aneurysm, dissection, vasculitis or significant stenosis. SMA: Patent without evidence of aneurysm, dissection, vasculitis or significant stenosis. Renals: Both renal arteries are patent without evidence of aneurysm, dissection, vasculitis, fibromuscular dysplasia or significant stenosis. IMA: Patent. Inflow: Patent without evidence of aneurysm, dissection, vasculitis or significant stenosis. Veins: No obvious venous abnormality within the limitations of this arterial phase study. Review of the MIP images confirms the above findings. NON-VASCULAR Hepatobiliary: No focal liver abnormality is seen. No gallstones, gallbladder wall thickening, or biliary dilatation. Pancreas: Unremarkable. No pancreatic  ductal dilatation or surrounding inflammatory changes. Spleen: Normal in size without focal abnormality. Adrenals/Urinary Tract: The adrenal glands are within normal limits.  The kidneys enhance symmetrically. No renal or ureteral calculus or obstructive uropathy bilaterally. The bladder is unremarkable. Stomach/Bowel: Stomach is within normal limits. Appendix appears normal. No evidence of bowel wall thickening, distention, or inflammatory changes. No free air or pneumatosis. Scattered diverticula are present along the colon without evidence of diverticulitis. Lymphatic: No abdominal or pelvic lymphadenopathy. Reproductive: Uterus and bilateral adnexa are unremarkable. Other: No abdominopelvic ascites. Musculoskeletal: Degenerative changes are present in the lumbar spine. No acute osseous abnormality. Review of the MIP images confirms the above findings. IMPRESSION: 1. Small segmental and subsegmental pulmonary emboli in the right upper and lower lobes. No evidence of right heart strain. 2. Aortic atherosclerosis without evidence aneurysm or dissection. 3. Colonic diverticulosis without diverticulitis. Electronically Signed: By: Thornell Sartorius M.D. On: 12/26/2022 22:53   DG Chest Port 1 View  Result Date: 12/26/2022 CLINICAL DATA:  Hypertension.  Pain. EXAM: PORTABLE CHEST 1 VIEW COMPARISON:  05/06/2020 FINDINGS: Stable heart size and mediastinal contours. Aortic atherosclerosis. Subsegmental opacity in the lung bases. No pleural effusion or pneumothorax. Normal pulmonary vasculature. Slight scoliotic curvature of the spine. No acute osseous findings. IMPRESSION: Subsegmental opacity in the lung bases, likely atelectasis. Electronically Signed   By: Narda Rutherford M.D.   On: 12/26/2022 22:08    Microbiology: Results for orders placed or performed during the hospital encounter of 09/05/09  Wound culture     Status: None   Collection Time: 09/05/09  3:10 PM   Specimen: Wound  Result Value Ref Range Status    Specimen Description WOUND THUMB RIGHT  Final   Special Requests PT ON CECLOR  Final   Gram Stain   Final    NO WBC SEEN NO SQUAMOUS EPITHELIAL CELLS SEEN NO ORGANISMS SEEN   Culture   Final    MULTIPLE ORGANISMS PRESENT, NONE PREDOMINANT Note: NO GROUP A STREP (S.PYOGENES) ISOLATED NO STAPHYLOCOCCUS AUREUS ISOLATED   Report Status 09/08/2009 FINAL  Final  Anaerobic culture     Status: None   Collection Time: 09/05/09  3:10 PM   Specimen: Wound  Result Value Ref Range Status   Specimen Description WOUND THUMB RIGHT  Final   Special Requests PT ON CECLOR  Final   Gram Stain   Final    NO WBC SEEN NO SQUAMOUS EPITHELIAL CELLS SEEN NO ORGANISMS SEEN   Culture NO ANAEROBES ISOLATED  Final   Report Status 09/10/2009 FINAL  Final    Labs: CBC: Recent Labs  Lab 12/26/22 1953 12/29/22 0215  WBC 9.7 7.4  HGB 12.2 12.0  HCT 37.4 37.4  MCV 93.7 93.3  PLT 302 295   Basic Metabolic Panel: Recent Labs  Lab 12/26/22 1953 12/29/22 0215  NA 139 138  K 3.9 4.4  CL 102 103  CO2 29 29  GLUCOSE 105* 116*  BUN 17 13  CREATININE 0.79 0.95  CALCIUM 9.3 9.4   Liver Function Tests: No results for input(s): "AST", "ALT", "ALKPHOS", "BILITOT", "PROT", "ALBUMIN" in the last 168 hours. CBG: No results for input(s): "GLUCAP" in the last 168 hours.  Discharge time spent: greater than 30 minutes.  Signed: Coralie Keens, MD [facility] 12/29/2022

## 2022-12-29 NOTE — Progress Notes (Signed)
   12/29/22 1249  AVS Discharge Documentation  AVS Discharge Instructions Including Medications Provided to patient/caregiver  Name of Person Receiving AVS Discharge Instructions Including Medications Shuron Cassity and her husband at the bedside.  Name of Clinician That Reviewed AVS Discharge Instructions Including Medications Anne Shutter, RN   [facility] pharmacy medications given to patient. Patient also received a printed prescription of eliquis. This was placed in discharge envelope along with AVS instructions.

## 2022-12-29 NOTE — Progress Notes (Signed)
3244: Patient reports tingling in hands and feet and feeling nauseated after hydralazine given this morning. Reports some tingling in feet still. Requesting that blood pressure be taken manually, states automatic blood pressure squeezes her arm too tight and her fingers turn blood. Manual blood pressure 140/60 after hydralazine given at 0659. Scheduled medications given at this time.

## 2023-01-08 DIAGNOSIS — I10 Essential (primary) hypertension: Secondary | ICD-10-CM | POA: Diagnosis not present

## 2023-01-08 DIAGNOSIS — Z6828 Body mass index (BMI) 28.0-28.9, adult: Secondary | ICD-10-CM | POA: Diagnosis not present

## 2023-01-08 DIAGNOSIS — I2699 Other pulmonary embolism without acute cor pulmonale: Secondary | ICD-10-CM | POA: Diagnosis not present

## 2023-01-08 DIAGNOSIS — Z09 Encounter for follow-up examination after completed treatment for conditions other than malignant neoplasm: Secondary | ICD-10-CM | POA: Diagnosis not present

## 2023-01-21 DIAGNOSIS — R5381 Other malaise: Secondary | ICD-10-CM | POA: Diagnosis not present

## 2023-01-21 DIAGNOSIS — I10 Essential (primary) hypertension: Secondary | ICD-10-CM | POA: Diagnosis not present

## 2023-01-21 DIAGNOSIS — R7303 Prediabetes: Secondary | ICD-10-CM | POA: Diagnosis not present

## 2023-01-21 DIAGNOSIS — F419 Anxiety disorder, unspecified: Secondary | ICD-10-CM | POA: Diagnosis not present

## 2023-02-11 DIAGNOSIS — M542 Cervicalgia: Secondary | ICD-10-CM | POA: Diagnosis not present

## 2023-02-14 DIAGNOSIS — M7918 Myalgia, other site: Secondary | ICD-10-CM | POA: Diagnosis not present

## 2023-02-14 DIAGNOSIS — M5412 Radiculopathy, cervical region: Secondary | ICD-10-CM | POA: Diagnosis not present

## 2023-02-26 DIAGNOSIS — M542 Cervicalgia: Secondary | ICD-10-CM | POA: Diagnosis not present

## 2023-02-26 DIAGNOSIS — M25511 Pain in right shoulder: Secondary | ICD-10-CM | POA: Diagnosis not present

## 2023-03-06 ENCOUNTER — Other Ambulatory Visit (HOSPITAL_COMMUNITY): Payer: Self-pay

## 2023-03-06 ENCOUNTER — Telehealth: Payer: Self-pay | Admitting: Pharmacist Clinician (PhC)/ Clinical Pharmacy Specialist

## 2023-03-06 ENCOUNTER — Telehealth: Payer: Self-pay | Admitting: Pharmacy Technician

## 2023-03-06 DIAGNOSIS — M25511 Pain in right shoulder: Secondary | ICD-10-CM | POA: Diagnosis not present

## 2023-03-06 DIAGNOSIS — M542 Cervicalgia: Secondary | ICD-10-CM | POA: Diagnosis not present

## 2023-03-06 MED ORDER — APIXABAN 5 MG PO TABS: 5.0000 mg | ORAL_TABLET | Freq: Two times a day (BID) | ORAL | 0 refills | Status: AC

## 2023-03-06 NOTE — Telephone Encounter (Signed)
Per test claim: The current 30 day co-pay is, $542.02.  No PA needed at this time. This test claim was processed through [facility]- copay amounts may vary at other pharmacies due to pharmacy/plan contracts, or as the patient moves through the different stages of their insurance plan.

## 2023-03-06 NOTE — Telephone Encounter (Signed)
Pt walked in, asking for Eliquis samples.  [employer], has separate PDP with $590 deductible.  Rx cost at $542 will cover all but $48 according to her plan.     Pt being treated for PE, 5 mg is correct dose regardless of age/SCr/Wt

## 2023-03-12 NOTE — Progress Notes (Signed)
PUO

## 2023-03-13 DIAGNOSIS — M25511 Pain in right shoulder: Secondary | ICD-10-CM | POA: Diagnosis not present

## 2023-03-13 DIAGNOSIS — M542 Cervicalgia: Secondary | ICD-10-CM | POA: Diagnosis not present

## 2023-03-20 DIAGNOSIS — M25511 Pain in right shoulder: Secondary | ICD-10-CM | POA: Diagnosis not present

## 2023-03-20 DIAGNOSIS — M542 Cervicalgia: Secondary | ICD-10-CM | POA: Diagnosis not present

## 2023-03-27 DIAGNOSIS — M25511 Pain in right shoulder: Secondary | ICD-10-CM | POA: Diagnosis not present

## 2023-03-27 DIAGNOSIS — M542 Cervicalgia: Secondary | ICD-10-CM | POA: Diagnosis not present

## 2023-03-29 MED ORDER — APIXABAN 5 MG PO TABS: 5.0000 mg | ORAL_TABLET | Freq: Two times a day (BID) | ORAL | Status: AC

## 2023-03-29 NOTE — Addendum Note (Signed)
 Addended by: Rosalee Kaufman on: 03/29/2023 12:36 PM   Modules accepted: Orders

## 2023-05-06 DIAGNOSIS — I10 Essential (primary) hypertension: Secondary | ICD-10-CM | POA: Diagnosis not present

## 2023-05-06 DIAGNOSIS — J45909 Unspecified asthma, uncomplicated: Secondary | ICD-10-CM | POA: Diagnosis not present

## 2023-05-06 DIAGNOSIS — E7800 Pure hypercholesterolemia, unspecified: Secondary | ICD-10-CM | POA: Diagnosis not present

## 2023-05-06 DIAGNOSIS — E781 Pure hyperglyceridemia: Secondary | ICD-10-CM | POA: Diagnosis not present

## 2023-06-05 DIAGNOSIS — E781 Pure hyperglyceridemia: Secondary | ICD-10-CM | POA: Diagnosis not present

## 2023-06-05 DIAGNOSIS — E7800 Pure hypercholesterolemia, unspecified: Secondary | ICD-10-CM | POA: Diagnosis not present

## 2023-06-05 DIAGNOSIS — J45909 Unspecified asthma, uncomplicated: Secondary | ICD-10-CM | POA: Diagnosis not present

## 2023-06-05 DIAGNOSIS — I10 Essential (primary) hypertension: Secondary | ICD-10-CM | POA: Diagnosis not present

## 2023-06-12 DIAGNOSIS — Z6829 Body mass index (BMI) 29.0-29.9, adult: Secondary | ICD-10-CM | POA: Diagnosis not present

## 2023-06-12 DIAGNOSIS — R399 Unspecified symptoms and signs involving the genitourinary system: Secondary | ICD-10-CM | POA: Diagnosis not present

## 2023-07-05 DIAGNOSIS — Z86711 Personal history of pulmonary embolism: Secondary | ICD-10-CM | POA: Diagnosis not present

## 2023-07-05 DIAGNOSIS — Z09 Encounter for follow-up examination after completed treatment for conditions other than malignant neoplasm: Secondary | ICD-10-CM | POA: Diagnosis not present

## 2023-07-05 DIAGNOSIS — I872 Venous insufficiency (chronic) (peripheral): Secondary | ICD-10-CM | POA: Diagnosis not present

## 2023-07-05 DIAGNOSIS — Z6829 Body mass index (BMI) 29.0-29.9, adult: Secondary | ICD-10-CM | POA: Diagnosis not present

## 2023-07-05 DIAGNOSIS — N390 Urinary tract infection, site not specified: Secondary | ICD-10-CM | POA: Diagnosis not present

## 2023-07-05 DIAGNOSIS — M7989 Other specified soft tissue disorders: Secondary | ICD-10-CM | POA: Diagnosis not present

## 2023-07-08 DIAGNOSIS — E782 Mixed hyperlipidemia: Secondary | ICD-10-CM | POA: Diagnosis not present

## 2023-07-08 DIAGNOSIS — R7309 Other abnormal glucose: Secondary | ICD-10-CM | POA: Diagnosis not present

## 2023-07-08 DIAGNOSIS — I10 Essential (primary) hypertension: Secondary | ICD-10-CM | POA: Diagnosis not present

## 2023-07-08 DIAGNOSIS — R600 Localized edema: Secondary | ICD-10-CM | POA: Diagnosis not present

## 2023-07-08 DIAGNOSIS — R635 Abnormal weight gain: Secondary | ICD-10-CM | POA: Diagnosis not present

## 2023-07-08 DIAGNOSIS — Z86711 Personal history of pulmonary embolism: Secondary | ICD-10-CM | POA: Diagnosis not present

## 2023-07-09 ENCOUNTER — Other Ambulatory Visit: Payer: Self-pay | Admitting: *Deleted

## 2023-07-09 DIAGNOSIS — M7989 Other specified soft tissue disorders: Secondary | ICD-10-CM

## 2023-07-11 ENCOUNTER — Encounter: Payer: Self-pay | Admitting: Cardiology

## 2023-07-29 ENCOUNTER — Ambulatory Visit (HOSPITAL_COMMUNITY)
Admission: RE | Admit: 2023-07-29 | Discharge: 2023-07-29 | Disposition: A | Discharge status: Home / Self Care | Source: Ambulatory Visit | Attending: Surgery | Admitting: Surgery

## 2023-07-29 DIAGNOSIS — M7989 Other specified soft tissue disorders: Secondary | ICD-10-CM | POA: Diagnosis not present

## 2023-08-08 DIAGNOSIS — G4709 Other insomnia: Secondary | ICD-10-CM | POA: Diagnosis not present

## 2023-08-08 DIAGNOSIS — F410 Panic disorder [episodic paroxysmal anxiety] without agoraphobia: Secondary | ICD-10-CM | POA: Diagnosis not present

## 2023-08-29 DIAGNOSIS — Z658 Other specified problems related to psychosocial circumstances: Secondary | ICD-10-CM | POA: Diagnosis not present

## 2023-08-29 DIAGNOSIS — K047 Periapical abscess without sinus: Secondary | ICD-10-CM | POA: Diagnosis not present

## 2023-08-29 DIAGNOSIS — Z6829 Body mass index (BMI) 29.0-29.9, adult: Secondary | ICD-10-CM | POA: Diagnosis not present

## 2023-08-29 DIAGNOSIS — I10 Essential (primary) hypertension: Secondary | ICD-10-CM | POA: Diagnosis not present

## 2023-09-04 ENCOUNTER — Ambulatory Visit: Attending: Vascular Surgery | Admitting: Physician Assistant

## 2023-09-04 VITALS — Temp 97.9°F | Wt 166.0 lb

## 2023-09-04 DIAGNOSIS — I872 Venous insufficiency (chronic) (peripheral): Secondary | ICD-10-CM | POA: Diagnosis not present

## 2023-09-04 DIAGNOSIS — M7989 Other specified soft tissue disorders: Secondary | ICD-10-CM

## 2023-09-04 DIAGNOSIS — I8392 Asymptomatic varicose veins of left lower extremity: Secondary | ICD-10-CM | POA: Insufficient documentation

## 2023-09-04 NOTE — Progress Notes (Signed)
 Requested by:  Okey Carlin Redbird, MD [address] [place],  [place] [address]  Reason for consultation: leg swelling    History of Present Illness   Dawn Payne is a 78 y.o. (09-25-1945) female who presents for evaluation of left leg swelling.  She says earlier this year she was placed on amlodipine  and valsartan for high blood pressure.  She states after starting these medications she started having issues with left leg swelling.  This worsened over the course of a couple of months.  Her swelling was worse later in the day after sitting for prolonged periods of time.  She tried to wear some zip up knee-high compression stockings for her swelling, which did help.  Eventually she was taken off of her amlodipine  and her left leg swelling dramatically improved.  Now she rarely has any issues with leg swelling.   She does not regularly elevate her legs.  She does currently wear compression stockings when on a plane.  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  She has no prior history of DVT, however she did have an acute PE in December 2024.  She has finished a 51-month course of Eliquis .  She is also previously had saline injections for her varicose veins bilaterally over 10 years ago.  She has had some recurrent varicose veins over the past couple of years and would like to get these removed.  These do not cause her any pain or discomfort.  Past Medical History:  Diagnosis Date   Anemia    Anxiety    pt denies at this time 07-27-15   Arthritis    ddd   Cardiac arrhythmia    palpitation   Colon polyps    adenomatous   Diverticulitis    Diverticulosis    Hypertriglyceridemia    Pneumonia    Vitamin D  deficiency     Past Surgical History:  Procedure Laterality Date   COLECTOMY  2008   due to kink from popcorn kernal   COLONOSCOPY     MENISCUS REPAIR Left    TONSILLECTOMY AND ADENOIDECTOMY      Social History    Socioeconomic History   Marital status: Married    Spouse name: Not on file   Number of children: 0   Years of education: Not on file   Highest education level: Not on file  Occupational History   Occupation: real estate  Tobacco Use   Smoking status: Never   Smokeless tobacco: Never  Substance and Sexual Activity   Alcohol use: Yes    Alcohol/week: 0.0 standard drinks of alcohol    Comment: only on social ocassion   Drug use: No   Sexual activity: Yes  Other Topics Concern   Not on file  Social History Narrative   Not on file   Social Drivers of Health   Financial Resource Strain: Not on file  Food Insecurity: No Food Insecurity (12/27/2022)   Hunger Vital Sign    Worried About Running Out of Food in the Last Year: Never true    Ran Out of Food in the Last Year: Never true  Transportation Needs: No Transportation Needs (12/27/2022)   PRAPARE - Administrator, Civil Service (Medical): No    Lack of Transportation (Non-Medical): No  Physical Activity: Not on file  Stress: Not on file  Social Connections: Unknown (06/20/2021)   Received from [facility]  Social Network    Social Network: Not on file  Intimate Partner Violence: Not At Risk (12/27/2022)   Humiliation, Afraid, Rape, and Kick questionnaire    Fear of Current or Ex-Partner: No    Emotionally Abused: No    Physically Abused: No    Sexually Abused: No    Family History  Problem Relation Age of Onset   Hypertension Mother    Kidney disease Mother    Heart disease Mother    Prostate cancer Father    Arthritis Sister    Colon cancer Neg Hx    Colon polyps Neg Hx    Esophageal cancer Neg Hx    Rectal cancer Neg Hx    Stomach cancer Neg Hx     Current Outpatient Medications  Medication Sig Dispense Refill   acetaminophen  (TYLENOL ) 325 MG tablet Take 2 tablets (650 mg total) by mouth every 6 (six) hours as needed.     albuterol  (VENTOLIN  HFA) 108 (90 Base) MCG/ACT inhaler as needed.      ALPRAZolam  (XANAX ) 0.5 MG tablet Take 0.5 mg by mouth as needed for sleep. *take 3 times a week*     amLODipine  (NORVASC ) 5 MG tablet Take 1 tablet (5 mg total) by mouth daily. 30 tablet 0   apixaban  (ELIQUIS ) 5 MG TABS tablet Take 1 tablet (5 mg total) by mouth 2 (two) times daily. (Patient not taking: Reported on 09/04/2023) 28 tablet 0   apixaban  (ELIQUIS ) 5 MG TABS tablet Take 1 tablet (5 mg total) by mouth 2 (two) times daily. (Patient not taking: Reported on 09/04/2023) 28 tablet    ezetimibe  (ZETIA ) 10 MG tablet Take 1 tablet (10 mg total) by mouth daily. (Patient not taking: Reported on 12/27/2022) 90 tablet 3   methocarbamol  (ROBAXIN ) 500 MG tablet Take 1 tablet (500 mg total) by mouth every 6 (six) hours as needed for muscle spasms. 20 tablet 0   SHINGRIX injection Inject 0.5 mLs into the muscle once.     No current facility-administered medications for this visit.    Allergies  Allergen Reactions   Sulfonamide Derivatives Other (See Comments)    Pt states she turns bright red   Statins     REVIEW OF SYSTEMS (negative unless checked):   Cardiac:  []  Chest pain or chest pressure? []  Shortness of breath upon activity? []  Shortness of breath when lying flat? []  Irregular heart rhythm?  Vascular:  []  Pain in calf, thigh, or hip brought on by walking? []  Pain in feet at night that wakes you up from your sleep? []  Blood clot in your veins? [x]  Leg swelling?  Pulmonary:  []  Oxygen at home? []  Productive cough? []  Wheezing?  Neurologic:  []  Sudden weakness in arms or legs? []  Sudden numbness in arms or legs? []  Sudden onset of difficult speaking or slurred speech? []  Temporary loss of vision in one eye? []  Problems with dizziness?  Gastrointestinal:  []  Blood in stool? []  Vomited blood?  Genitourinary:  []  Burning when urinating? []  Blood in urine?  Psychiatric:  []  Major depression  Hematologic:  []  Bleeding problems? []  Problems with blood  clotting?  Dermatologic:  []  Rashes or ulcers?  Constitutional:  []  Fever or chills?  Ear/Nose/Throat:  []  Change in hearing? []  Nose bleeds? []  Sore throat?  Musculoskeletal:  []  Back pain? []  Joint pain? [x]  Muscle pain?   Physical Examination     Vitals:   09/04/23 0829  Temp: 97.9 F (36.6 C)  TempSrc: Temporal  Weight: 166 lb (75.3 kg)   Body mass index is 28.94 kg/m.  General:  WDWN in NAD; vital signs documented above Gait: Not observed HENT: WNL, normocephalic Pulmonary: normal non-labored breathing  Cardiac: regular HR Abdomen: soft, NT, no masses Skin: without rashes Vascular Exam/Pulses: Palpable pedal pulses Extremities: LLE with varicose veins, with reticular veins, without edema, without stasis pigmentation, without lipodermatosclerosis, without ulcers Musculoskeletal: no muscle wasting or atrophy  Neurologic: A&O X 3;  No focal weakness or paresthesias are detected Psychiatric:  The pt has Normal affect.  Non-invasive Vascular Imaging   +--------------+---------+------+-----------+------------+---------+  LEFT         Reflux NoRefluxReflux TimeDiameter cmsComments                           Yes                                    +--------------+---------+------+-----------+------------+---------+  CFV                    yes   >1 second                        +--------------+---------+------+-----------+------------+---------+  FV mid        no                                               +--------------+---------+------+-----------+------------+---------+  Popliteal              yes   >1 second                        +--------------+---------+------+-----------+------------+---------+  GSV at SFJ              yes    >500 ms      0.57               +--------------+---------+------+-----------+------------+---------+  GSV prox thigh          yes    >500 ms      0.40                +--------------+---------+------+-----------+------------+---------+  GSV mid thigh           yes    >500 ms      0.30               +--------------+---------+------+-----------+------------+---------+  GSV dist thighno                            0.30               +--------------+---------+------+-----------+------------+---------+  GSV at knee   no                            0.31               +--------------+---------+------+-----------+------------+---------+  GSV prox calf           yes    >500 ms      0.31               +--------------+---------+------+-----------+------------+---------+  GSV mid calf  yes    >500 ms      0.29               +--------------+---------+------+-----------+------------+---------+  SSV Pop Fossa no                            0.18               +--------------+---------+------+-----------+------------+---------+  SSV prox calf                                       too small  +--------------+---------+------+-----------+------------+---------+  SSV mid calf  no                            0.22               +--------------+---------+------+-----------+------------+---------+               no                            0.24               +--------------+---------+------+-----------+------------+---------+    Medical Decision Making   KAEDE CLENDENEN is a 78 y.o. female who presents for evaluation of leg swelling  Based on the patient's duplex, there is reflux in the left common femoral vein, popliteal vein, and greater saphenous vein at the saphenofemoral junction, proximal thigh, mid thigh, proximal calf, and mid calf.  There is no evidence of DVT or SVT on exam.  She would not be a candidate for saphenous vein ablation given that her GSV is too small in the thigh. The patient reports new onset left leg swelling shortly after starting amlodipine  and valsartan for her high blood pressure.   Her leg swelling was present from the ankle to the knee.  Aggravating factors included prolonged sitting.  She says several weeks ago she was taken off of her amlodipine , and her left leg swelling has gotten significantly better.  She now rarely has any issues with left leg swelling.  She does not elevate her legs.  She occasionally wears knee-high compression stockings during travel. She also has a long history of asymptomatic varicose veins in bilateral legs.  She has previously received saline injections in her varicose veins over 10 years ago.  She has recurrent varicose veins in her left lower leg, which remain asymptomatic.  She would still like to get these removed. On exam she has no significant edema of bilateral lower extremities.  She has several small, scattered reticular and spider veins on both legs.  She has a few small varicose veins on her left distal shin.  I have explained to the patient that she does have evidence of venous insufficiency in her left lower extremity, however her greater saphenous vein is not amenable to ablation.  Although she has venous reflux, I believe her acute issues with left leg swelling a couple of months ago was due to her blood pressure medication.  Overall I have encouraged her to regularly elevate her legs, exercise, and wear compression stockings daily to help control any chronic swelling issues she may have.  I have discussed with the patient that she could receive stat phlebectomy for her varicose veins, however the cost would be out-of-pocket.  She would still like to get this done.  I will refer her to Dr. Sheree for a follow-up to discuss future stab phlebectomy.  Otherwise she can follow-up as needed for venous insufficiency.   Dawn Delapena Dawn Holster, PA-C [facility] Office: [phone]  09/04/2023, 8:32 AM  Call MD: Magda

## 2023-09-09 DIAGNOSIS — R071 Chest pain on breathing: Secondary | ICD-10-CM | POA: Diagnosis not present

## 2023-09-09 DIAGNOSIS — M545 Low back pain, unspecified: Secondary | ICD-10-CM | POA: Diagnosis not present

## 2023-09-09 DIAGNOSIS — R109 Unspecified abdominal pain: Secondary | ICD-10-CM | POA: Diagnosis not present

## 2023-09-10 DIAGNOSIS — R109 Unspecified abdominal pain: Secondary | ICD-10-CM | POA: Diagnosis not present

## 2023-09-19 ENCOUNTER — Other Ambulatory Visit: Payer: Self-pay | Admitting: Cardiology

## 2023-09-19 DIAGNOSIS — E7800 Pure hypercholesterolemia, unspecified: Secondary | ICD-10-CM

## 2023-09-30 DIAGNOSIS — D3131 Benign neoplasm of right choroid: Secondary | ICD-10-CM | POA: Diagnosis not present

## 2023-09-30 DIAGNOSIS — H524 Presbyopia: Secondary | ICD-10-CM | POA: Diagnosis not present

## 2023-09-30 DIAGNOSIS — H2512 Age-related nuclear cataract, left eye: Secondary | ICD-10-CM | POA: Diagnosis not present

## 2023-10-09 ENCOUNTER — Encounter: Payer: Self-pay | Admitting: Vascular Surgery

## 2023-10-09 ENCOUNTER — Ambulatory Visit: Attending: Vascular Surgery | Admitting: Vascular Surgery

## 2023-10-09 VITALS — Ht 63.5 in | Wt 166.0 lb

## 2023-10-09 DIAGNOSIS — I872 Venous insufficiency (chronic) (peripheral): Secondary | ICD-10-CM | POA: Insufficient documentation

## 2023-10-09 NOTE — Progress Notes (Signed)
 Patient ID: Dawn Payne, female   DOB: 10/04/1945, 78 y.o.   MRN: [id number]  Reason for Consult: Follow-up   Referred by Dawn Carlin Redbird, MD  Subjective:     HPI:  Dawn Payne is a 78 y.o. female with history of right lower extremity DVT.  She also has bilateral lower extremity swelling which was worse when she was taking amlodipine  this has since been discontinued.  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  She has persistent swelling and pain particularly in the left lower extremity with associated varicosities.  She has had previous interventions of her left lower extremity which included injecting veins but she has never had ablation or phlebectomy.  She does have family history of varicosities in her mother.  She has never been pregnant.  Past Medical History:  Diagnosis Date   Anemia    Anxiety    pt denies at this time 07-27-15   Arthritis    ddd   Cardiac arrhythmia    palpitation   Colon polyps    adenomatous   Diverticulitis    Diverticulosis    Hypertriglyceridemia    Pneumonia    Vitamin D  deficiency    Family History  Problem Relation Age of Onset   Hypertension Mother    Kidney disease Mother    Heart disease Mother    Prostate cancer Father    Arthritis Sister    Colon cancer Neg Hx    Colon polyps Neg Hx    Esophageal cancer Neg Hx    Rectal cancer Neg Hx    Stomach cancer Neg Hx    Past Surgical History:  Procedure Laterality Date   COLECTOMY  2008   due to kink from popcorn kernal   COLONOSCOPY     MENISCUS REPAIR Left    TONSILLECTOMY AND ADENOIDECTOMY      Short Social History:  Social History   Tobacco Use   Smoking status: Never   Smokeless tobacco: Never  Substance Use Topics   Alcohol use: Yes    Alcohol/week: 0.0 standard drinks of alcohol    Comment: only on social ocassion    Allergies  Allergen  Reactions   Sulfonamide Derivatives Other (See Comments)    Pt states she turns bright red   Statins     Current Outpatient Medications  Medication Sig Dispense Refill   acetaminophen  (TYLENOL ) 325 MG tablet Take 2 tablets (650 mg total) by mouth every 6 (six) hours as needed.     albuterol  (VENTOLIN  HFA) 108 (90 Base) MCG/ACT inhaler as needed.     ALPRAZolam  (XANAX ) 0.5 MG tablet Take 0.5 mg by mouth as needed for sleep. *take 3 times a week*     amLODipine  (NORVASC ) 5 MG tablet Take 1 tablet (5 mg total) by mouth daily. 30 tablet 0   ezetimibe  (ZETIA ) 10 MG tablet TAKE 1 TABLET BY MOUTH EVERY DAY 30 tablet 0   methocarbamol  (ROBAXIN ) 500 MG tablet Take 1 tablet (500 mg total) by mouth every 6 (six) hours as needed for muscle spasms. 20 tablet 0   SHINGRIX injection Inject 0.5 mLs into the muscle once.     apixaban  (ELIQUIS ) 5 MG TABS tablet Take 1 tablet (5 mg total) by mouth 2 (two) times daily. (Patient not taking: Reported on 09/04/2023) 28 tablet 0   apixaban  (ELIQUIS ) 5 MG TABS  tablet Take 1 tablet (5 mg total) by mouth 2 (two) times daily. (Patient not taking: Reported on 09/04/2023) 28 tablet    No current facility-administered medications for this visit.    Review of Systems  Constitutional:  Constitutional negative. HENT: HENT negative.  Eyes: Eyes negative.  Cardiovascular: Positive for leg swelling.  GI: Gastrointestinal negative.  Musculoskeletal: Positive for leg pain.  Neurological: Neurological negative. Hematologic: Hematologic/lymphatic negative.  Psychiatric: Psychiatric negative.        Objective:  Objective   Vitals:   10/09/23 0843  Weight: 166 lb (75.3 kg)  Height: 5' 3.5 (1.613 m)   Body mass index is 28.94 kg/m.  Physical Exam HENT:     Head: Normocephalic.     Mouth/Throat:     Mouth: Mucous membranes are moist.  Eyes:     Pupils: Pupils are equal, round, and reactive to light.  Cardiovascular:     Rate and Rhythm: Normal rate.   Pulmonary:     Effort: Pulmonary effort is normal.  Abdominal:     General: Abdomen is flat.  Musculoskeletal:     Cervical back: Normal range of motion.     Right lower leg: No edema.     Left lower leg: Edema present.     Comments: There is swelling with loss of calf muscle definition on the left consistent with C3 venous disease  Skin:    General: Skin is warm.     Capillary Refill: Capillary refill takes less than 2 seconds.  Neurological:     General: No focal deficit present.     Mental Status: She is alert.     Data: LEFT          Reflux NoRefluxReflux TimeDiameter cmsComments                           Yes                                    +--------------+---------+------+-----------+------------+---------+  CFV                    yes   >1 second                        +--------------+---------+------+-----------+------------+---------+  FV mid        no                                               +--------------+---------+------+-----------+------------+---------+  Popliteal              yes   >1 second                        +--------------+---------+------+-----------+------------+---------+  GSV at SFJ              yes    >500 ms      0.57               +--------------+---------+------+-----------+------------+---------+  GSV prox thigh          yes    >500 ms      0.40               +--------------+---------+------+-----------+------------+---------+  GSV mid  thigh           yes    >500 ms      0.30               +--------------+---------+------+-----------+------------+---------+  GSV dist thighno                            0.30               +--------------+---------+------+-----------+------------+---------+  GSV at knee   no                            0.31               +--------------+---------+------+-----------+------------+---------+  GSV prox calf           yes    >500 ms       0.31               +--------------+---------+------+-----------+------------+---------+  GSV mid calf            yes    >500 ms      0.29               +--------------+---------+------+-----------+------------+---------+  SSV Pop Fossa no                            0.18               +--------------+---------+------+-----------+------------+---------+  SSV prox calf                                       too small  +--------------+---------+------+-----------+------------+---------+  SSV mid calf  no                            0.22               +--------------+---------+------+-----------+------------+---------+               no                            0.24               +--------------+---------+------+-----------+------------+---------+         Summary:  Left:  - No evidence of deep vein thrombosis seen in the left lower extremity,  from the common femoral through the popliteal veins.  - No evidence of superficial venous thrombosis in the left lower  extremity.  - No evidence of superficial venous reflux seen in the left short  saphenous vein.  - Venous reflux is noted in the left common femoral vein.  - Venous reflux is noted in the left sapheno-femoral junction.  - Venous reflux is noted in the left greater saphenous vein in the thigh.  - Venous reflux is noted in the left greater saphenous vein in the calf.  - Venous reflux is noted in the left popliteal vein.      Assessment/Plan:     78 year old female with history of DVT in the right lower extremity with concomitant PE now with left lower extremity swelling and large refluxing greater saphenous vein consistent with C3 venous disease with symptomatic varicosities.  We discussed proceeding with left greater saphenous  vein ablation with stab lobectomy between 10 and 20 and will need follow-up consideration of sclerotherapy approximately 2 vials.  We discussed the risk particularly of DVT  and I have asked her to restart aspirin therapy which has been held for tooth extraction.  All other risk benefits alternatives and need for follow-up were discussed and patient demonstrates good understanding.     Penne Lonni Colorado MD [facility]

## 2023-10-10 DIAGNOSIS — R7309 Other abnormal glucose: Secondary | ICD-10-CM | POA: Diagnosis not present

## 2023-10-14 DIAGNOSIS — R7309 Other abnormal glucose: Secondary | ICD-10-CM | POA: Diagnosis not present

## 2023-10-14 DIAGNOSIS — I10 Essential (primary) hypertension: Secondary | ICD-10-CM | POA: Diagnosis not present

## 2023-10-14 DIAGNOSIS — E782 Mixed hyperlipidemia: Secondary | ICD-10-CM | POA: Diagnosis not present

## 2023-10-14 DIAGNOSIS — Z86711 Personal history of pulmonary embolism: Secondary | ICD-10-CM | POA: Diagnosis not present

## 2023-10-15 ENCOUNTER — Other Ambulatory Visit: Payer: Self-pay | Admitting: Cardiology

## 2023-10-15 DIAGNOSIS — E7800 Pure hypercholesterolemia, unspecified: Secondary | ICD-10-CM

## 2023-10-17 DIAGNOSIS — E782 Mixed hyperlipidemia: Secondary | ICD-10-CM | POA: Diagnosis not present

## 2023-11-11 DIAGNOSIS — Z23 Encounter for immunization: Secondary | ICD-10-CM | POA: Diagnosis not present

## 2023-11-28 DIAGNOSIS — R7303 Prediabetes: Secondary | ICD-10-CM | POA: Diagnosis not present

## 2023-11-28 DIAGNOSIS — E7800 Pure hypercholesterolemia, unspecified: Secondary | ICD-10-CM | POA: Diagnosis not present

## 2023-11-28 DIAGNOSIS — E559 Vitamin D deficiency, unspecified: Secondary | ICD-10-CM | POA: Diagnosis not present

## 2023-11-29 ENCOUNTER — Telehealth: Payer: Self-pay | Admitting: *Deleted

## 2023-12-02 DIAGNOSIS — Z6828 Body mass index (BMI) 28.0-28.9, adult: Secondary | ICD-10-CM | POA: Diagnosis not present

## 2023-12-02 DIAGNOSIS — R7303 Prediabetes: Secondary | ICD-10-CM | POA: Diagnosis not present

## 2023-12-02 DIAGNOSIS — Z23 Encounter for immunization: Secondary | ICD-10-CM | POA: Diagnosis not present

## 2023-12-02 DIAGNOSIS — Z0000 Encounter for general adult medical examination without abnormal findings: Secondary | ICD-10-CM | POA: Diagnosis not present

## 2023-12-02 DIAGNOSIS — Z86711 Personal history of pulmonary embolism: Secondary | ICD-10-CM | POA: Diagnosis not present

## 2023-12-02 DIAGNOSIS — I10 Essential (primary) hypertension: Secondary | ICD-10-CM | POA: Diagnosis not present

## 2023-12-02 DIAGNOSIS — J45909 Unspecified asthma, uncomplicated: Secondary | ICD-10-CM | POA: Diagnosis not present

## 2023-12-02 DIAGNOSIS — E7800 Pure hypercholesterolemia, unspecified: Secondary | ICD-10-CM | POA: Diagnosis not present

## 2023-12-18 DIAGNOSIS — G4709 Other insomnia: Secondary | ICD-10-CM | POA: Diagnosis not present

## 2023-12-18 DIAGNOSIS — F410 Panic disorder [episodic paroxysmal anxiety] without agoraphobia: Secondary | ICD-10-CM | POA: Diagnosis not present

## 2023-12-26 NOTE — Progress Notes (Signed)
 HPI: FU atherosclerosis. Patient seen previously for cardiomegaly and palpitations. Chest x-ray April 2022 showed mild cardiomegaly and aortic atherosclerosis.  Nuclear study September 2022 showed ejection fraction 63% and no ischemia or infarction. Ca score 11/23 0.  Had provoked PE 11/24. CTA November 2024 showed small segmental and subsegmental pulmonary emboli.  Echocardiogram November 2024 showed ejection fraction 55 to 60%, mild left ventricular hypertrophy, mild left atrial enlargement, trace aortic insufficiency.  Since last seen, there is no dyspnea, chest pain, palpitations or syncope.  Current Outpatient Medications  Medication Sig Dispense Refill   furosemide (LASIX) 20 MG tablet 1 tablet Orally Once a day; Duration: 90 days As needed     valsartan (DIOVAN) 320 MG tablet 1 tablet Orally Once a day; Duration: 90 days     acetaminophen  (TYLENOL ) 325 MG tablet Take 2 tablets (650 mg total) by mouth every 6 (six) hours as needed. (Patient not taking: Reported on 01/08/2024)     albuterol  (VENTOLIN  HFA) 108 (90 Base) MCG/ACT inhaler as needed. (Patient not taking: Reported on 01/08/2024)     ALPRAZolam  (XANAX ) 0.5 MG tablet Take 0.5 mg by mouth as needed for sleep. *take 3 times a week*     amLODipine  (NORVASC ) 5 MG tablet Take 1 tablet (5 mg total) by mouth daily. (Patient not taking: Reported on 01/08/2024) 30 tablet 0   apixaban  (ELIQUIS ) 5 MG TABS tablet Take 1 tablet (5 mg total) by mouth 2 (two) times daily. (Patient not taking: Reported on 01/08/2024) 28 tablet 0   apixaban  (ELIQUIS ) 5 MG TABS tablet Take 1 tablet (5 mg total) by mouth 2 (two) times daily. (Patient not taking: Reported on 01/08/2024) 28 tablet    ezetimibe  (ZETIA ) 10 MG tablet TAKE 1 TABLET BY MOUTH EVERY DAY (Patient not taking: Reported on 01/08/2024) 30 tablet 3   methocarbamol  (ROBAXIN ) 500 MG tablet Take 1 tablet (500 mg total) by mouth every 6 (six) hours as needed for muscle spasms. (Patient not taking: Reported  on 01/08/2024) 20 tablet 0   SHINGRIX injection Inject 0.5 mLs into the muscle once.     No current facility-administered medications for this visit.     Past Medical History:  Diagnosis Date   Anemia    Anxiety    pt denies at this time 07-27-15   Arthritis    ddd   Cardiac arrhythmia    palpitation   Colon polyps    adenomatous   Diverticulitis    Diverticulosis    Hypertriglyceridemia    Pneumonia    Vitamin D  deficiency     Past Surgical History:  Procedure Laterality Date   COLECTOMY  2008   due to kink from popcorn kernal   COLONOSCOPY     MENISCUS REPAIR Left    TONSILLECTOMY AND ADENOIDECTOMY      Social History   Socioeconomic History   Marital status: Married    Spouse name: Not on file   Number of children: 0   Years of education: Not on file   Highest education level: Not on file  Occupational History   Occupation: real estate  Tobacco Use   Smoking status: Never   Smokeless tobacco: Never  Substance and Sexual Activity   Alcohol use: Yes    Alcohol/week: 0.0 standard drinks of alcohol    Comment: only on social ocassion   Drug use: No   Sexual activity: Yes  Other Topics Concern   Not on file  Social History Narrative  Not on file   Social Drivers of Health   Financial Resource Strain: Not on file  Food Insecurity: No Food Insecurity (12/27/2022)   Hunger Vital Sign    Worried About Running Out of Food in the Last Year: Never true    Ran Out of Food in the Last Year: Never true  Transportation Needs: No Transportation Needs (12/27/2022)   PRAPARE - Administrator, Civil Service (Medical): No    Lack of Transportation (Non-Medical): No  Physical Activity: Not on file  Stress: Not on file  Social Connections: Unknown (06/20/2021)   [facility]   Social Network    Social Network: Not on file  Intimate Partner Violence: Not At Risk (12/27/2022)   Humiliation, Afraid, Rape, and Kick questionnaire    Fear of  Current or Ex-Partner: No    Emotionally Abused: No    Physically Abused: No    Sexually Abused: No    Family History  Problem Relation Age of Onset   Hypertension Mother    Kidney disease Mother    Heart disease Mother    Prostate cancer Father    Arthritis Sister    Colon cancer Neg Hx    Colon polyps Neg Hx    Esophageal cancer Neg Hx    Rectal cancer Neg Hx    Stomach cancer Neg Hx     ROS: no fevers or chills, productive cough, hemoptysis, dysphasia, odynophagia, melena, hematochezia, dysuria, hematuria, rash, seizure activity, orthopnea, PND, pedal edema, claudication. Remaining systems are negative.  Physical Exam: Well-developed well-nourished in no acute distress.  Skin is warm and dry.  HEENT is normal.  Neck is supple.  Chest is clear to auscultation with normal expansion.  Cardiovascular exam is regular rate and rhythm.  Abdominal exam nontender or distended. No masses palpated. Extremities show no edema. neuro grossly intact  EKG Interpretation Date/Time:  Wednesday January 08 2024 09:49:58 EST Ventricular Rate:  77 PR Interval:  122 QRS Duration:  74 QT Interval:  372 QTC Calculation: 420 R Axis:   -2  Text Interpretation: Sinus rhythm with frequent Premature ventricular complexes Minimal voltage criteria for LVH, may be normal variant ( R in aVL ) Nonspecific ST abnormality Confirmed by Pietro Rogue ([id number]) on 01/08/2024 9:51:15 AM    A/P  1 aortic atherosclerosis-patient is intolerant to statins.  2 hyperlipidemia-intolerant to statins.  Recent LDL elevated but she was not taking her Zetia .  Resume at 10 mg daily.  Check lipids and liver in 8 weeks.  If LDL not at goal we will consider PCSK9 inhibitor.  3 history of provoked pulmonary embolus-now off of anticoagulation.  Rogue Pietro, MD

## 2024-01-08 ENCOUNTER — Ambulatory Visit: Attending: Cardiology | Admitting: Cardiology

## 2024-01-08 ENCOUNTER — Encounter: Payer: Self-pay | Admitting: Cardiology

## 2024-01-08 VITALS — BP 128/60 | HR 76 | Ht 63.75 in | Wt 159.0 lb

## 2024-01-08 DIAGNOSIS — E7800 Pure hypercholesterolemia, unspecified: Secondary | ICD-10-CM | POA: Diagnosis present

## 2024-01-08 DIAGNOSIS — I700 Atherosclerosis of aorta: Secondary | ICD-10-CM | POA: Insufficient documentation

## 2024-01-08 MED ORDER — EZETIMIBE 10 MG PO TABS: 10.0000 mg | ORAL_TABLET | Freq: Every day | ORAL | 3 refills | Status: DC

## 2024-01-08 NOTE — Patient Instructions (Signed)
 Medication Instructions:   START EZETIMIBE  10 MG ONCE DAILY  *If you need a refill on your cardiac medications before your next appointment, please call your pharmacy*  Lab Work:  Your physician recommends that you return for lab work in: 8 [facility]  If you have labs (blood work) drawn today and your tests are completely normal, you will receive your results only by: MyChart Message (if you have MyChart) OR A paper copy in the mail If you have any lab test that is abnormal or we need to change your treatment, we will call you to review the results.  Follow-Up: At [facility], you and your health needs are our priority.  As part of our continuing mission to provide you with exceptional heart care, our providers are all part of one team.  This team includes your primary Cardiologist (physician) and Advanced Practice Providers or APPs (Physician Assistants and Nurse Practitioners) who all work together to provide you with the care you need, when you need it.  Your next appointment:   12 month(s)  Provider:   REDELL SHALLOW MD

## 2024-01-21 ENCOUNTER — Encounter: Payer: Self-pay | Admitting: *Deleted

## 2024-01-22 ENCOUNTER — Telehealth: Payer: Self-pay

## 2024-01-22 NOTE — Telephone Encounter (Signed)
 Pt called to schedule vein procedure in January or February.  Pt states she has to defer procedure to a later date due to spouse's treatment and upcoming surgery.

## 2024-01-25 ENCOUNTER — Other Ambulatory Visit: Payer: Self-pay | Admitting: Cardiology

## 2024-01-25 DIAGNOSIS — E7800 Pure hypercholesterolemia, unspecified: Secondary | ICD-10-CM
